# Patient Record
Sex: Female | Born: 1940 | Race: White | Hispanic: No | Marital: Single | Smoking: Never smoker
Health system: Southern US, Community
[De-identification: ages and names within clinical notes are randomized; demographics above are authoritative.]

## PROBLEM LIST (undated history)

## (undated) DIAGNOSIS — G8220 Paraplegia, unspecified: Secondary | ICD-10-CM

## (undated) DIAGNOSIS — I472 Ventricular tachycardia, unspecified: Secondary | ICD-10-CM

## (undated) DIAGNOSIS — D696 Thrombocytopenia, unspecified: Secondary | ICD-10-CM

## (undated) DIAGNOSIS — F0390 Unspecified dementia without behavioral disturbance: Secondary | ICD-10-CM

## (undated) DIAGNOSIS — R011 Cardiac murmur, unspecified: Secondary | ICD-10-CM

## (undated) DIAGNOSIS — I10 Essential (primary) hypertension: Secondary | ICD-10-CM

## (undated) DIAGNOSIS — A809 Acute poliomyelitis, unspecified: Secondary | ICD-10-CM

## (undated) HISTORY — PX: TONSILLECTOMY: SUR1361

---

## 2012-02-12 ENCOUNTER — Emergency Department (HOSPITAL_COMMUNITY): Payer: Medicare Other

## 2012-02-12 ENCOUNTER — Emergency Department (HOSPITAL_COMMUNITY)
Admission: EM | Admit: 2012-02-12 | Discharge: 2012-02-12 | Disposition: A | Discharge status: Skilled Nursing Facility | Payer: Medicare Other | Attending: Emergency Medicine | Admitting: Emergency Medicine

## 2012-02-12 ENCOUNTER — Encounter (HOSPITAL_COMMUNITY): Payer: Self-pay | Admitting: Emergency Medicine

## 2012-02-12 DIAGNOSIS — Z8679 Personal history of other diseases of the circulatory system: Secondary | ICD-10-CM | POA: Insufficient documentation

## 2012-02-12 DIAGNOSIS — Z862 Personal history of diseases of the blood and blood-forming organs and certain disorders involving the immune mechanism: Secondary | ICD-10-CM | POA: Insufficient documentation

## 2012-02-12 DIAGNOSIS — R011 Cardiac murmur, unspecified: Secondary | ICD-10-CM | POA: Insufficient documentation

## 2012-02-12 DIAGNOSIS — F0390 Unspecified dementia without behavioral disturbance: Secondary | ICD-10-CM | POA: Insufficient documentation

## 2012-02-12 DIAGNOSIS — Z8612 Personal history of poliomyelitis: Secondary | ICD-10-CM | POA: Insufficient documentation

## 2012-02-12 DIAGNOSIS — Z79899 Other long term (current) drug therapy: Secondary | ICD-10-CM | POA: Insufficient documentation

## 2012-02-12 DIAGNOSIS — E876 Hypokalemia: Secondary | ICD-10-CM | POA: Insufficient documentation

## 2012-02-12 DIAGNOSIS — I10 Essential (primary) hypertension: Secondary | ICD-10-CM | POA: Insufficient documentation

## 2012-02-12 DIAGNOSIS — N390 Urinary tract infection, site not specified: Secondary | ICD-10-CM | POA: Insufficient documentation

## 2012-02-12 HISTORY — DX: Ventricular tachycardia, unspecified: I47.20

## 2012-02-12 HISTORY — DX: Cardiac murmur, unspecified: R01.1

## 2012-02-12 HISTORY — DX: Unspecified dementia, unspecified severity, without behavioral disturbance, psychotic disturbance, mood disturbance, and anxiety: F03.90

## 2012-02-12 HISTORY — DX: Acute poliomyelitis, unspecified: A80.9

## 2012-02-12 HISTORY — DX: Essential (primary) hypertension: I10

## 2012-02-12 HISTORY — DX: Thrombocytopenia, unspecified: D69.6

## 2012-02-12 HISTORY — DX: Ventricular tachycardia: I47.2

## 2012-02-12 LAB — COMPREHENSIVE METABOLIC PANEL
Albumin: 2.6 g/dL — ABNORMAL LOW (ref 3.5–5.2)
BUN: 33 mg/dL — ABNORMAL HIGH (ref 6–23)
CO2: 31 mEq/L (ref 19–32)
Chloride: 84 mEq/L — ABNORMAL LOW (ref 96–112)
Creatinine, Ser: 0.51 mg/dL (ref 0.50–1.10)
GFR calc Af Amer: >90 mL/min (ref >90)
GFR calc non Af Amer: >90 mL/min (ref >90)
Glucose, Bld: 122 mg/dL — ABNORMAL HIGH (ref 70–99)
Total Bilirubin: 0.5 mg/dL (ref 0.3–1.2)

## 2012-02-12 LAB — URINE MICROSCOPIC-ADD ON

## 2012-02-12 LAB — CBC WITH DIFFERENTIAL/PLATELET
Basophils Absolute: 0 10*3/uL (ref 0.0–0.1)
Basophils Relative: 0 % (ref 0–1)
Eosinophils Relative: 0 % (ref 0–5)
Lymphocytes Relative: 3 % — ABNORMAL LOW (ref 12–46)
MCHC: 34.7 g/dL (ref 30.0–36.0)
Neutro Abs: 14 10*3/uL — ABNORMAL HIGH (ref 1.7–7.7)
Platelets: DECREASED 10*3/uL (ref 150–400)
RDW: 13.4 % (ref 11.5–15.5)
WBC: 16.7 10*3/uL — ABNORMAL HIGH (ref 4.0–10.5)

## 2012-02-12 LAB — URINALYSIS, ROUTINE W REFLEX MICROSCOPIC
Nitrite: POSITIVE — AB
Protein, ur: NEGATIVE mg/dL
Urobilinogen, UA: 1 mg/dL (ref 0.0–1.0)

## 2012-02-12 MED ORDER — DEXTROSE 5 % IV SOLN
1.0000 g | Freq: Once | INTRAVENOUS | Status: AC
  Administered 2012-02-12: 1 g via INTRAVENOUS
  Filled 2012-02-12: qty 10

## 2012-02-12 MED ORDER — POTASSIUM CHLORIDE ER 10 MEQ PO TBCR: 20.0000 meq | EXTENDED_RELEASE_TABLET | Freq: Two times a day (BID) | ORAL | Status: DC

## 2012-02-12 MED ORDER — POTASSIUM CHLORIDE CRYS ER 20 MEQ PO TBCR
60.0000 meq | EXTENDED_RELEASE_TABLET | Freq: Once | ORAL | Status: AC
  Administered 2012-02-12: 60 meq via ORAL
  Filled 2012-02-12: qty 2
  Filled 2012-02-12: qty 1

## 2012-02-12 MED ORDER — CEFUROXIME AXETIL 250 MG PO TABS: 250.0000 mg | ORAL_TABLET | Freq: Two times a day (BID) | ORAL | Status: DC

## 2012-02-12 NOTE — ED Notes (Signed)
ZOX:[id number]<AV> Expected date:02/12/12<BR> Expected time: 7:22 PM<BR> Means of arrival:Ambulance<BR> Comments:<BR> Gen weakness ? fever

## 2012-02-12 NOTE — ED Notes (Signed)
Pt from [facility]. C/O fever and chills for last hour. Not given tylenol. No previous complaints.

## 2012-02-12 NOTE — ED Provider Notes (Signed)
History     CSN: [id number]  Arrival date & time 02/12/12  1927   First MD Initiated Contact with Patient 02/12/12 1953      Chief Complaint  Patient presents with  . Fever    (Consider location/radiation/quality/duration/timing/severity/associated sxs/prior treatment) HPI Comments: Patient comes to the ER for evaluation of fever. Patient reports that she has frequent fevers date back to when she had polio. She says she does a lot of housework today and thinks that brought on fevers. She hasn't shows and symptoms lasted about an hour and then resolved. Patient without complaints now. She says she did not want to comment, but her doctor told her to. She has not had sore throat, cough, chest pain, abdominal pain, nausea, vomiting or diarrhea. She denies urinary symptoms.  Patient is a 72 y.o. female presenting with fever.  Fever Primary symptoms of the febrile illness include fever.    Past Medical History  Diagnosis Date  . Dementia   . Hypertension   . Heart murmur   . Polio   . Thrombocytopenia   . V-tach     No past surgical history on file.  No family history on file.  History  Substance Use Topics  . Smoking status: Not on file  . Smokeless tobacco: Not on file  . Alcohol Use:     OB History    Grav Para Term Preterm Abortions TAB SAB Ect Mult Living                  Review of Systems  Constitutional: Positive for fever.  All other systems reviewed and are negative.    Allergies  Review of patient's allergies indicates no known allergies.  Home Medications   Current Outpatient Rx  Name  Route  Sig  Dispense  Refill  . CALCIUM CARBONATE-VITAMIN D 500-200 MG-UNIT PO TABS   Oral   Take 1 tablet by mouth daily.         [name] DIVALPROEX SODIUM 125 MG PO TBEC   Oral   Take 125 mg by mouth 3 (three) times daily.         . DONEPEZIL HCL 5 MG PO TABS   Oral   Take 5 mg by mouth every evening.         . FUROSEMIDE 40 MG PO TABS   Oral   Take 40  mg by mouth daily.         [name] GABAPENTIN 100 MG PO CAPS   Oral   Take 100 mg by mouth daily.         [name] LORAZEPAM 0.5 MG PO TABS   Oral   Take 0.25 mg by mouth 2 (two) times daily.         [name] MEMANTINE HCL 5 MG PO TABS   Oral   Take 5 mg by mouth 2 (two) times daily.         [name] METOLAZONE 2.5 MG PO TABS   Oral   Take 2.5 mg by mouth daily.         [name] POTASSIUM CHLORIDE CRYS ER 20 MEQ PO TBCR   Oral   Take 20 mEq by mouth 3 (three) times daily.         . QUETIAPINE FUMARATE 50 MG PO TABS   Oral   Take 50 mg by mouth 2 (two) times daily.         . SERTRALINE HCL 50 MG PO TABS   Oral  Take 50 mg by mouth daily.           BP 117/54  Pulse 86  Temp 99.9 F (37.7 C) (Oral)  Resp 18  SpO2 97%  Physical Exam  Constitutional: She is oriented to person, place, and time. She appears well-developed and well-nourished. No distress.  HENT:  Head: Normocephalic and atraumatic.  Right Ear: Hearing normal.  Nose: Nose normal.  Mouth/Throat: Oropharynx is clear and moist and mucous membranes are normal.  Eyes: Conjunctivae normal and EOM are normal. Pupils are equal, round, and reactive to light.  Neck: Normal range of motion. Neck supple.  Cardiovascular: Normal rate, regular rhythm, S1 normal and S2 normal.  Exam reveals no gallop and no friction rub.   No murmur heard. Pulmonary/Chest: Effort normal and breath sounds normal. No respiratory distress. She exhibits no tenderness.  Abdominal: Soft. Normal appearance and bowel sounds are normal. There is no hepatosplenomegaly. There is no tenderness. There is no rebound, no guarding, no tenderness at McBurney's point and negative Murphy's sign. No hernia.  Musculoskeletal: Normal range of motion.  Neurological: She is alert and oriented to person, place, and time. She has normal strength. No cranial nerve deficit or sensory deficit. Coordination normal. GCS eye subscore is 4. GCS verbal subscore is 5. GCS motor subscore  is 6.  Skin: Skin is warm, dry and intact. No rash noted. No cyanosis.  Psychiatric: She has a normal mood and affect. Her speech is normal and behavior is normal. Thought content normal.    ED Course  Procedures (including critical care time)  Labs Reviewed  COMPREHENSIVE METABOLIC PANEL - Abnormal; Notable for the following:    Sodium 128 (*)     Potassium 2.6 (*)     Chloride 84 (*)     Glucose, Bld 122 (*)     BUN 33 (*)     Total Protein 5.8 (*)     Albumin 2.6 (*)     All other components within normal limits  URINALYSIS, ROUTINE W REFLEX MICROSCOPIC - Abnormal; Notable for the following:    APPearance CLOUDY (*)     Hgb urine dipstick SMALL (*)     Nitrite POSITIVE (*)     Leukocytes, UA MODERATE (*)     All other components within normal limits  CBC WITH DIFFERENTIAL - Abnormal; Notable for the following:    WBC 16.7 (*)     Neutrophils Relative 84 (*)     Neutro Abs 14.0 (*)     Lymphocytes Relative 3 (*)     Lymphs Abs 0.5 (*)     Monocytes Relative 13 (*)     Monocytes Absolute 2.2 (*)     All other components within normal limits  URINE MICROSCOPIC-ADD ON - Abnormal; Notable for the following:    Bacteria, UA FEW (*)     All other components within normal limits  CULTURE, BLOOD (ROUTINE X 2)  CULTURE, BLOOD (ROUTINE X 2)  URINE CULTURE   No results found.   Diagnosis: Urinary tract infection; Fever    MDM  Patient presents for evaluation of fever. Her examination was unremarkable. Workup does reveal evidence of urinary tract infection, likely the source of the patient's fever. She does not appear septic. Patient administered Rocephin and discharged back to the nursing home.        Gilda Crease, MD 02/15/12 [phone]

## 2012-02-14 NOTE — ED Notes (Signed)
[facility] lab reports positive blood culture; copy of chart to ED for review

## 2012-02-14 NOTE — ED Notes (Addendum)
Chart returned from EDP office  . Phone not in service. Patient presents with fever and elevated WBC on lab work. Call patient and if improving recommend f/u with PCP otherwise have patient return for possible admission per Fayrene Helper.

## 2012-02-15 LAB — URINE CULTURE

## 2012-02-16 LAB — CULTURE, BLOOD (ROUTINE X 2)

## 2012-02-16 NOTE — ED Notes (Signed)
+   Urine Patient treated with Ceftin-sensitive to same-Chart appended per protocol MD.

## 2012-02-19 LAB — CULTURE, BLOOD (ROUTINE X 2)

## 2012-05-20 ENCOUNTER — Emergency Department (HOSPITAL_COMMUNITY): Payer: Medicare Other

## 2012-05-20 ENCOUNTER — Encounter (HOSPITAL_COMMUNITY): Payer: Self-pay

## 2012-05-20 ENCOUNTER — Inpatient Hospital Stay (HOSPITAL_COMMUNITY)
Admission: EM | Admit: 2012-05-20 | Discharge: 2012-05-23 | DRG: 872 | Disposition: A | Discharge status: Skilled Nursing Facility | Payer: Medicare Other | Attending: Internal Medicine | Admitting: Internal Medicine

## 2012-05-20 DIAGNOSIS — F329 Major depressive disorder, single episode, unspecified: Secondary | ICD-10-CM

## 2012-05-20 DIAGNOSIS — I517 Cardiomegaly: Secondary | ICD-10-CM | POA: Diagnosis present

## 2012-05-20 DIAGNOSIS — R739 Hyperglycemia, unspecified: Secondary | ICD-10-CM

## 2012-05-20 DIAGNOSIS — E876 Hypokalemia: Secondary | ICD-10-CM

## 2012-05-20 DIAGNOSIS — F32A Depression, unspecified: Secondary | ICD-10-CM

## 2012-05-20 DIAGNOSIS — I959 Hypotension, unspecified: Secondary | ICD-10-CM

## 2012-05-20 DIAGNOSIS — E162 Hypoglycemia, unspecified: Secondary | ICD-10-CM | POA: Diagnosis present

## 2012-05-20 DIAGNOSIS — A419 Sepsis, unspecified organism: Secondary | ICD-10-CM

## 2012-05-20 DIAGNOSIS — D72829 Elevated white blood cell count, unspecified: Secondary | ICD-10-CM

## 2012-05-20 DIAGNOSIS — D696 Thrombocytopenia, unspecified: Secondary | ICD-10-CM

## 2012-05-20 DIAGNOSIS — A4151 Sepsis due to Escherichia coli [E. coli]: Principal | ICD-10-CM | POA: Diagnosis present

## 2012-05-20 DIAGNOSIS — F0390 Unspecified dementia without behavioral disturbance: Secondary | ICD-10-CM

## 2012-05-20 DIAGNOSIS — Z79899 Other long term (current) drug therapy: Secondary | ICD-10-CM

## 2012-05-20 DIAGNOSIS — N390 Urinary tract infection, site not specified: Secondary | ICD-10-CM

## 2012-05-20 DIAGNOSIS — F3289 Other specified depressive episodes: Secondary | ICD-10-CM

## 2012-05-20 DIAGNOSIS — Z8612 Personal history of poliomyelitis: Secondary | ICD-10-CM

## 2012-05-20 LAB — RAPID URINE DRUG SCREEN, HOSP PERFORMED
Amphetamines: NOT DETECTED
Benzodiazepines: NOT DETECTED
Cocaine: NOT DETECTED
Opiates: NOT DETECTED
Tetrahydrocannabinol: NOT DETECTED

## 2012-05-20 LAB — CBC WITH DIFFERENTIAL/PLATELET
Basophils Absolute: 0 10*3/uL (ref 0.0–0.1)
Basophils Relative: 0 % (ref 0–1)
Eosinophils Absolute: 0 10*3/uL (ref 0.0–0.7)
Hemoglobin: 12.1 g/dL (ref 12.0–15.0)
Lymphocytes Relative: 5 % — ABNORMAL LOW (ref 12–46)
MCH: 30.5 pg (ref 26.0–34.0)
MCHC: 33.8 g/dL (ref 30.0–36.0)
Monocytes Absolute: 1.6 10*3/uL — ABNORMAL HIGH (ref 0.1–1.0)
Neutrophils Relative %: 85 % — ABNORMAL HIGH (ref 43–77)
Platelets: 67 10*3/uL — ABNORMAL LOW (ref 150–400)

## 2012-05-20 LAB — URINALYSIS, ROUTINE W REFLEX MICROSCOPIC
Glucose, UA: NEGATIVE mg/dL
Ketones, ur: NEGATIVE mg/dL
Nitrite: POSITIVE — AB
Protein, ur: 30 mg/dL — AB
Urobilinogen, UA: 1 mg/dL (ref 0.0–1.0)

## 2012-05-20 LAB — POCT I-STAT, CHEM 8
BUN: 29 mg/dL — ABNORMAL HIGH (ref 6–23)
Chloride: 94 mEq/L — ABNORMAL LOW (ref 96–112)
Creatinine, Ser: 0.6 mg/dL (ref 0.50–1.10)
Potassium: 2.6 mEq/L — CL (ref 3.5–5.1)
Sodium: 139 mEq/L (ref 135–145)

## 2012-05-20 MED ORDER — SODIUM CHLORIDE 0.9 % IV BOLUS (SEPSIS)
500.0000 mL | Freq: Once | INTRAVENOUS | Status: AC
  Administered 2012-05-20: 500 mL via INTRAVENOUS

## 2012-05-20 MED ORDER — SODIUM CHLORIDE 0.9 % IV BOLUS (SEPSIS)
1000.0000 mL | Freq: Once | INTRAVENOUS | Status: AC
  Administered 2012-05-20: 1000 mL via INTRAVENOUS

## 2012-05-20 MED ORDER — DEXTROSE 5 % IV SOLN
1.0000 g | Freq: Once | INTRAVENOUS | Status: DC
  Administered 2012-05-21: 1 g via INTRAVENOUS
  Filled 2012-05-20: qty 10

## 2012-05-20 MED ORDER — VANCOMYCIN HCL IN DEXTROSE 1-5 GM/200ML-% IV SOLN
1000.0000 mg | Freq: Once | INTRAVENOUS | Status: AC
  Administered 2012-05-21: 1000 mg via INTRAVENOUS
  Filled 2012-05-20: qty 200

## 2012-05-20 MED ORDER — SODIUM CHLORIDE 0.9 % IV BOLUS (SEPSIS): 500.0000 mL | Freq: Once | INTRAVENOUS | Status: DC

## 2012-05-20 MED ORDER — ACETAMINOPHEN 325 MG PO TABS
650.0000 mg | ORAL_TABLET | Freq: Once | ORAL | Status: AC
  Administered 2012-05-20: 650 mg via ORAL
  Filled 2012-05-20: qty 2

## 2012-05-20 MED ORDER — POTASSIUM CHLORIDE CRYS ER 20 MEQ PO TBCR: 40.0000 meq | EXTENDED_RELEASE_TABLET | Freq: Once | ORAL | Status: DC

## 2012-05-20 NOTE — ED Notes (Signed)
ZOX:WR60<AV> Expected date:05/20/12<BR> Expected time: 9:33 PM<BR> Means of arrival:Ambulance<BR> Comments:<BR> Fever, AMS reported by staff

## 2012-05-20 NOTE — ED Notes (Signed)
Per EMS, Pt from [facility].  Per Facility, Pt has a fever 99.4 and has been altered since 3pm.  Per EMS, pt alert to self and answering questions appropriately.  Hx Polio, HTN, Vtach, Heart murmur, depression. Vitals:  146/82, hr 82, resp 20, CBG 145.

## 2012-05-20 NOTE — ED Notes (Signed)
Notified by Barbette Merino that pupils pinpoint.  Notified Dondra Spry, NP of findings.

## 2012-05-20 NOTE — ED Notes (Signed)
Critical I stat - Potassium 2.6.  [place] reported to Dondra Spry, NP [phone].  Per Dondra Spry, order BMP to confirm.  [place] Bridge RN aware.

## 2012-05-21 DIAGNOSIS — A419 Sepsis, unspecified organism: Secondary | ICD-10-CM | POA: Diagnosis present

## 2012-05-21 DIAGNOSIS — D72829 Elevated white blood cell count, unspecified: Secondary | ICD-10-CM | POA: Diagnosis present

## 2012-05-21 DIAGNOSIS — F0390 Unspecified dementia without behavioral disturbance: Secondary | ICD-10-CM | POA: Diagnosis present

## 2012-05-21 DIAGNOSIS — N390 Urinary tract infection, site not specified: Secondary | ICD-10-CM | POA: Diagnosis present

## 2012-05-21 DIAGNOSIS — D696 Thrombocytopenia, unspecified: Secondary | ICD-10-CM | POA: Diagnosis present

## 2012-05-21 DIAGNOSIS — E876 Hypokalemia: Secondary | ICD-10-CM | POA: Diagnosis present

## 2012-05-21 DIAGNOSIS — R739 Hyperglycemia, unspecified: Secondary | ICD-10-CM | POA: Diagnosis present

## 2012-05-21 DIAGNOSIS — F329 Major depressive disorder, single episode, unspecified: Secondary | ICD-10-CM | POA: Diagnosis present

## 2012-05-21 LAB — COMPREHENSIVE METABOLIC PANEL
CO2: 33 mEq/L — ABNORMAL HIGH (ref 19–32)
Calcium: 7.8 mg/dL — ABNORMAL LOW (ref 8.4–10.5)
Creatinine, Ser: 0.35 mg/dL — ABNORMAL LOW (ref 0.50–1.10)
GFR calc Af Amer: >90 mL/min (ref >90)
GFR calc non Af Amer: >90 mL/min (ref >90)
Glucose, Bld: 120 mg/dL — ABNORMAL HIGH (ref 70–99)
Total Protein: 5.3 g/dL — ABNORMAL LOW (ref 6.0–8.3)

## 2012-05-21 LAB — HEMOGLOBIN A1C: Mean Plasma Glucose: 94 mg/dL (ref <117)

## 2012-05-21 LAB — APTT: aPTT: 28 seconds (ref 24–37)

## 2012-05-21 LAB — CBC WITH DIFFERENTIAL/PLATELET
Hemoglobin: 12.4 g/dL (ref 12.0–15.0)
Lymphocytes Relative: 9 % — ABNORMAL LOW (ref 12–46)
Lymphs Abs: 1.1 10*3/uL (ref 0.7–4.0)
Neutro Abs: 10 10*3/uL — ABNORMAL HIGH (ref 1.7–7.7)
Neutrophils Relative %: 82 % — ABNORMAL HIGH (ref 43–77)
Platelets: 74 10*3/uL — ABNORMAL LOW (ref 150–400)
RBC: 3.97 MIL/uL (ref 3.87–5.11)
WBC: 12.1 10*3/uL — ABNORMAL HIGH (ref 4.0–10.5)

## 2012-05-21 LAB — TSH: TSH: 1.715 u[IU]/mL (ref 0.350–4.500)

## 2012-05-21 LAB — GLUCOSE, CAPILLARY: Glucose-Capillary: 80 mg/dL (ref 70–99)

## 2012-05-21 LAB — PROTIME-INR
INR: 1.32 (ref 0.00–1.49)
Prothrombin Time: 16.1 seconds — ABNORMAL HIGH (ref 11.6–15.2)

## 2012-05-21 LAB — LACTIC ACID, PLASMA: Lactic Acid, Venous: 0.8 mmol/L (ref 0.5–2.2)

## 2012-05-21 LAB — POTASSIUM: Potassium: 3.2 mEq/L — ABNORMAL LOW (ref 3.5–5.1)

## 2012-05-21 MED ORDER — SODIUM CHLORIDE 0.9 % IV BOLUS (SEPSIS)
1000.0000 mL | Freq: Once | INTRAVENOUS | Status: AC
  Administered 2012-05-21: 1000 mL via INTRAVENOUS

## 2012-05-21 MED ORDER — ONDANSETRON HCL 4 MG/2ML IJ SOLN: 4.0000 mg | Freq: Four times a day (QID) | INTRAMUSCULAR | Status: DC | PRN

## 2012-05-21 MED ORDER — BIOTENE DRY MOUTH MT LIQD
15.0000 mL | Freq: Two times a day (BID) | OROMUCOSAL | Status: DC
  Administered 2012-05-21 – 2012-05-23 (×5): 15 mL via OROMUCOSAL

## 2012-05-21 MED ORDER — SODIUM CHLORIDE 0.9 % IJ SOLN
3.0000 mL | Freq: Two times a day (BID) | INTRAMUSCULAR | Status: DC
  Administered 2012-05-21 – 2012-05-22 (×3): 3 mL via INTRAVENOUS

## 2012-05-21 MED ORDER — CALCIUM CARBONATE-VITAMIN D 500-200 MG-UNIT PO TABS
1.0000 | ORAL_TABLET | Freq: Every day | ORAL | Status: DC
Start: 2012-05-21 — End: 2012-05-23
  Administered 2012-05-21 – 2012-05-23 (×3): 1 via ORAL
  Filled 2012-05-21 (×3): qty 1

## 2012-05-21 MED ORDER — ONDANSETRON HCL 4 MG PO TABS: 4.0000 mg | ORAL_TABLET | Freq: Four times a day (QID) | ORAL | Status: DC | PRN

## 2012-05-21 MED ORDER — HYDROCODONE-ACETAMINOPHEN 5-325 MG PO TABS: 1.0000 | ORAL_TABLET | ORAL | Status: DC | PRN

## 2012-05-21 MED ORDER — VANCOMYCIN HCL IN DEXTROSE 1-5 GM/200ML-% IV SOLN
1000.0000 mg | Freq: Two times a day (BID) | INTRAVENOUS | Status: DC
  Administered 2012-05-21 – 2012-05-23 (×4): 1000 mg via INTRAVENOUS
  Filled 2012-05-21 (×6): qty 200

## 2012-05-21 MED ORDER — POTASSIUM CHLORIDE 10 MEQ/100ML IV SOLN
10.0000 meq | INTRAVENOUS | Status: AC
  Administered 2012-05-21 (×5): 10 meq via INTRAVENOUS
  Filled 2012-05-21: qty 200
  Filled 2012-05-21: qty 300

## 2012-05-21 MED ORDER — DONEPEZIL HCL 5 MG PO TABS
5.0000 mg | ORAL_TABLET | Freq: Every evening | ORAL | Status: DC
  Administered 2012-05-21 – 2012-05-22 (×2): 5 mg via ORAL
  Filled 2012-05-21 (×3): qty 1

## 2012-05-21 MED ORDER — QUETIAPINE FUMARATE 50 MG PO TABS
50.0000 mg | ORAL_TABLET | Freq: Two times a day (BID) | ORAL | Status: DC
  Administered 2012-05-21 – 2012-05-23 (×5): 50 mg via ORAL
  Filled 2012-05-21 (×6): qty 1

## 2012-05-21 MED ORDER — GABAPENTIN 100 MG PO CAPS
100.0000 mg | ORAL_CAPSULE | Freq: Every day | ORAL | Status: DC
  Administered 2012-05-21 – 2012-05-23 (×3): 100 mg via ORAL
  Filled 2012-05-21 (×3): qty 1

## 2012-05-21 MED ORDER — ENSURE PLUS PO LIQD
237.0000 mL | Freq: Every day | ORAL | Status: DC
  Administered 2012-05-21 – 2012-05-23 (×3): 237 mL via ORAL
  Filled 2012-05-21 (×3): qty 237

## 2012-05-21 MED ORDER — SODIUM CHLORIDE 0.9 % IV SOLN
INTRAVENOUS | Status: DC
Start: 2012-05-21 — End: 2012-05-21
  Administered 2012-05-21: 75 mL via INTRAVENOUS

## 2012-05-21 MED ORDER — POTASSIUM CHLORIDE CRYS ER 20 MEQ PO TBCR: 40.0000 meq | EXTENDED_RELEASE_TABLET | Freq: Once | ORAL | Status: DC

## 2012-05-21 MED ORDER — POTASSIUM CHLORIDE IN NACL 40-0.9 MEQ/L-% IV SOLN
INTRAVENOUS | Status: DC
  Administered 2012-05-21 (×2): via INTRAVENOUS
  Filled 2012-05-21 (×6): qty 1000

## 2012-05-21 MED ORDER — DIVALPROEX SODIUM 125 MG PO DR TAB
125.0000 mg | DELAYED_RELEASE_TABLET | Freq: Three times a day (TID) | ORAL | Status: DC
  Administered 2012-05-21 – 2012-05-23 (×7): 125 mg via ORAL
  Filled 2012-05-21 (×9): qty 1

## 2012-05-21 MED ORDER — POTASSIUM CHLORIDE CRYS ER 20 MEQ PO TBCR
20.0000 meq | EXTENDED_RELEASE_TABLET | Freq: Three times a day (TID) | ORAL | Status: DC
  Administered 2012-05-21 – 2012-05-22 (×6): 20 meq via ORAL
  Filled 2012-05-21 (×9): qty 1

## 2012-05-21 MED ORDER — DEXTROSE 5 % IV SOLN
1.0000 g | INTRAVENOUS | Status: DC
  Administered 2012-05-22 – 2012-05-23 (×2): 1 g via INTRAVENOUS
  Filled 2012-05-21 (×2): qty 10

## 2012-05-21 MED ORDER — POTASSIUM CHLORIDE 10 MEQ/100ML IV SOLN
10.0000 meq | INTRAVENOUS | Status: AC
  Administered 2012-05-21 (×4): 10 meq via INTRAVENOUS
  Filled 2012-05-21: qty 300
  Filled 2012-05-21: qty 100

## 2012-05-21 MED ORDER — MEMANTINE HCL 5 MG PO TABS
5.0000 mg | ORAL_TABLET | Freq: Two times a day (BID) | ORAL | Status: DC
  Administered 2012-05-21 – 2012-05-23 (×5): 5 mg via ORAL
  Filled 2012-05-21 (×6): qty 1

## 2012-05-21 MED ORDER — LORAZEPAM 0.5 MG PO TABS
0.2500 mg | ORAL_TABLET | Freq: Two times a day (BID) | ORAL | Status: DC
  Administered 2012-05-21 – 2012-05-23 (×5): 0.25 mg via ORAL
  Filled 2012-05-21 (×5): qty 1

## 2012-05-21 MED ORDER — SERTRALINE HCL 50 MG PO TABS
50.0000 mg | ORAL_TABLET | Freq: Every day | ORAL | Status: DC
  Administered 2012-05-21 – 2012-05-23 (×3): 50 mg via ORAL
  Filled 2012-05-21 (×3): qty 1

## 2012-05-21 MED ORDER — MORPHINE SULFATE 2 MG/ML IJ SOLN: 1.0000 mg | INTRAMUSCULAR | Status: DC | PRN

## 2012-05-21 MED ORDER — ACETAMINOPHEN 325 MG PO TABS: 325.0000 mg | ORAL_TABLET | Freq: Four times a day (QID) | ORAL | Status: DC | PRN

## 2012-05-21 NOTE — Evaluation (Signed)
Physical Therapy Evaluation Patient Details Name: Carolyn Ford MRN: [id number] DOB: May 29, 1940 Today's Date: 05/21/2012 Time: 1478-2956 PT Time Calculation (min): 41 min  PT Assessment / Plan / Recommendation Clinical Impression  72 yo female with h/o polio/ bilateral paralysis who was admitted /05/20/12 with UTI, early sepsis, hypotension. Pt. also has h/o dementia. Per [facility] staff pt required 2 person assist for "stand-pivot"  transfer to [facility], pt able to propel WC independently., total assist for ADL's. Pt will benefit from PT while in acute care.  Facility will need to determine if pt care can be provided at pt's current level. Both lower legs/feet mottled and cool to touch. RN noted also.    PT Assessment  Patient needs continued PT services    Follow Up Recommendations  Home health PT (unless higher level of care is necessary)    Does the patient have the potential to tolerate intense rehabilitation      Barriers to Discharge        Equipment Recommendations  None recommended by PT    Recommendations for Other Services     Frequency Min 3X/week    Precautions / Restrictions Precautions Precautions: Fall Precaution Comments: LE paralysis from polio Required Braces or Orthoses: Other Brace/Splint Other Brace/Splint: bil AFO's   Pertinent Vitals/Pain C/o both feet sensitive, noted mottling and coolness of both lower legs(stocking glove pattern). RN aware and noted and stated the feet have looked this way since admit.      Mobility  Bed Mobility Bed Mobility: Rolling Right;Rolling Left;Supine to Sit;Sit to Supine Rolling Right: 1: +2 Total assist Rolling Right: Patient Percentage: 10% Rolling Left: 1: +2 Total assist Rolling Left: Patient Percentage: 10% Supine to Sit: 1: +2 Total assist Supine to Sit: Patient Percentage: 30% Sit to Supine: 1: +2 Total assist Details for Bed Mobility Assistance: pt able to pull self into long sitting in bed with 2  armhold,  pt requires  total assistance to move to full sitting at the edge of bed, pt. did assist with moving R leg with UE's Transfers Transfers: Not assessed    Exercises     PT Diagnosis: Generalized weakness;Acute pain  PT Problem List: Decreased strength;Decreased activity tolerance;Decreased balance;Decreased mobility;Decreased cognition;Decreased knowledge of use of DME;Decreased safety awareness;Decreased knowledge of precautions;Impaired tone;Impaired sensation;Pain PT Treatment Interventions: DME instruction;Functional mobility training;Therapeutic activities;Balance training;Patient/family education   PT Goals Acute Rehab PT Goals PT Goal Formulation: Patient unable to participate in goal setting Time For Goal Achievement: 06/04/12 Potential to Achieve Goals: Fair Pt will Roll Supine to Right Side: with min assist PT Goal: Rolling Supine to Right Side - Progress: Goal set today Pt will Roll Supine to Left Side: with min assist PT Goal: Rolling Supine to Left Side - Progress: Goal set today Pt will go Supine/Side to Sit: with min assist PT Goal: Supine/Side to Sit - Progress: Goal set today Pt will go Sit to Supine/Side: with min assist PT Goal: Sit to Supine/Side - Progress: Goal set today Pt will Transfer Bed to Chair/Chair to Bed: with +2 total assist PT Transfer Goal: Bed to Chair/Chair to Bed - Progress: Goal set today Pt will Propel Wheelchair: 10 - 50 feet;with supervision PT Goal: Propel Wheelchair - Progress: Goal set today  Visit Information  Last PT Received On: 05/21/12 Assistance Needed: +2    Subjective Data  Subjective: I am in [place] Patient Stated Goal: pt unable    Prior Functioning  Home Living Type of Home: Assisted living Home Adaptive  Equipment: Wheelchair - manual Prior Function Level of Independence: Needs assistance Needs Assistance: Bathing;Dressing;Grooming;Toileting;Transfers Bath: Total Dressing: Total Grooming: Total Toileting:  Total Transfer Assistance:  +2 pivot using bil AFO Comments: per staff at [facility] pt was a 2 person assist to stand and pivot to WC using bil. ankle braces. assist for all ADL's. Communication Communication: Expressive difficulties    Cognition  Cognition Arousal/Alertness: Awake/alert Behavior During Therapy: Anxious Overall Cognitive Status: History of cognitive impairments - at baseline Area of Impairment: Orientation;Memory Orientation Level: Place;Time;Situation General Comments: pt constantly talking nonsensible conversation, at times sensible in describing how she transfers, although not completely sensible.    Extremity/Trunk Assessment Right Upper Extremity Assessment RUE ROM/Strength/Tone: [facility] for tasks assessed Left Upper Extremity Assessment LUE ROM/Strength/Tone: WFL for tasks assessed Right Lower Extremity Assessment RLE ROM/Strength/Tone: Deficits RLE ROM/Strength/Tone Deficits: trace forefoot movement. flaccid hip to fooot. RLE Sensation: Deficits RLE Sensation Deficits: hypersensitive to touch foot at any location Left Lower Extremity Assessment LLE ROM/Strength/Tone: Deficits LLE ROM/Strength/Tone Deficits: same as right LLE Sensation: Deficits LLE Sensation Deficits: same as right. Trunk Assessment Trunk Assessment: Kyphotic   Balance Balance Balance Assessed: Yes Static Sitting Balance Static Sitting - Balance Support: Bilateral upper extremity supported Static Sitting - Level of Assistance: 5: Stand by assistance;4: Min assist Static Sitting - Comment/# of Minutes: pt  will lose balance and unable to catch self if leans too far to side.   End of Session PT - End of Session Activity Tolerance: Patient limited by fatigue Patient left: in chair Nurse Communication: Need for lift equipment  GP     Rada Hay 05/21/2012, 2:23 PM Blanchard Kelch PT [phone]

## 2012-05-21 NOTE — Plan of Care (Signed)
Problem: Consults Goal: Skin Care Protocol Initiated - if Braden Score 18 or less If consults are not indicated, leave blank or document N/A Outcome: Completed/Met Date Met:  05/21/12 Q2hr turn, elevate heels

## 2012-05-21 NOTE — Progress Notes (Signed)
0815 - Recheck CBG=63. Administered another 4oz of apple juice. MD is going to put in diet order. Will continue to monitor.  Langley Gauss, RN

## 2012-05-21 NOTE — Clinical Social Work Psychosocial (Signed)
Clinical Social Work Department BRIEF PSYCHOSOCIAL ASSESSMENT 05/21/2012  Patient:  Carolyn Ford, Carolyn Ford     Account Number:  [id number]     Admit date:  05/20/2012  Clinical Social Worker:  Vennie Homans, Theresia Majors  Date/Time:  05/21/2012 12:00 N  Referred by:  CSW  Date Referred:  05/21/2012 Referred for  Other - See comment   Other Referral:   ADMITTED FROM ALF   Interview type:  Family Other interview type:   DISCUSSION WITH PT AND PHONE CALL TO NEPHEW/POA, Carolyn Ford    PSYCHOSOCIAL DATA Living Status:  FACILITY Admitted from facility:  [facility] Level of care:  Assisted Living Primary support name:  Carolyn Ford Primary support relationship to patient:  FAMILY Degree of support available:   POA HELPS MAKE DECISIONS    CURRENT CONCERNS Current Concerns  Post-Acute Placement   Other Concerns:   RETURN TO ALF WHEN MEDICALLY ABLE    SOCIAL WORK ASSESSMENT / PLAN CSW met with Pt and spoke with POA. Pt has been at ALF since July and plans to return once stable. Both want Pt to return to that ALF at discharge. CSW will work on return to ALF as appropriate.   Assessment/plan status:  Psychosocial Support/Ongoing Assessment of Needs Other assessment/ plan:   return to ALF   Information/referral to community resources:    PATIENT'S/FAMILY'S RESPONSE TO PLAN OF CARE: Pt and nephew aware and agree to plan to return to ALF at d/c. CSW not aware of any issues that would impact on change in dispo at this time. CSW to follow and assist.   Doreen Salvage, LCSW ICU/Stepdown Clinical Social Worker [facility] Cell [phone] Hours 8am-1200pm M-F

## 2012-05-21 NOTE — Progress Notes (Signed)
CARE MANAGEMENT NOTE 05/21/2012  Patient:  Carolyn Ford, Carolyn Ford   Account Number:  [id number]  Date Initiated:  05/21/2012  Documentation initiated by:  Roselle Norton  Subjective/Objective Assessment:   pt with fever, ams, wbc over 13.0 K 2.3,  hypotensive even after ns boluses.     Action/Plan:   from [facility] senior care   Anticipated DC Date:  05/24/2012   Anticipated DC Plan:  ASSISTED LIVING / REST HOME  In-house referral  Clinical Social Worker      DC Planning Services  NA      [facility] Choice  NA   Choice offered to / List presented to:  NA   DME arranged  NA      DME agency  NA     HH arranged  NA      HH agency  NA   Status of service:  In process, will continue to follow Medicare Important Message given?  NA - LOS <3 / Initial given by admissions (If response is "NO", the following Medicare IM given date fields will be blank) Date Medicare IM given:   Date Additional Medicare IM given:    Discharge Disposition:    Per UR Regulation:  Reviewed for med. necessity/level of care/duration of stay  If discussed at Long Length of Stay Meetings, dates discussed:    Comments:  [id number] Earlene Plater, RN, BSN, CCM:  CHART REVIEWED AND UPDATED.  Next chart review due on [id number]. NO DISCHARGE NEEDS PRESENT AT THIS TIME. CASE MANAGEMENT [phone]

## 2012-05-21 NOTE — H&P (Signed)
[facility] History and Physical  Carolyn Ford ZOX:[id number] DOB: [birth year] DOA: 05/20/2012  Referring physician: ED physician PCP: No primary provider on file.   Chief Complaint: Fever, confusion  HPI:  72 year old female with past medical history of polio, dementia and depression who presented from [facility] to [facility] ED for fevers and worsening mental status changes. Please note that the patient is not a good historian due to history of dementia. History is provided from ED physician and NH records. In nursing home, patient spike fever of 99.4 and appeared to be more confused than her usual baseline mental status. There was no respiratory distress noted in the nursing home. No falls. In ED, patient was hypotensive with BP of 96/35 and HR in range of  70-75; Tmax of 100.4 F and O2 saturation of 98% on 2 L nasal canula. Please note that the patient only received 500 cc of normal saline in ED. Blood pressure remains low at 98/43. CXR was significant for mild cardiomegaly and possible vascular congestion. CT head shows no acute intracranial findings. CBC  revealed leukocytosis of 16 and platelet count of 67. We do not have other labs available in Epic for comparison. UA was significant for WBC 21-50 and small leukocyte esterases. BMP was significant for hypokalemia of 2.6. Procalcitonin and lactic acid are still pending but obvious concern is for sepsis for which reason we are admitting the patient to SDU.  Assessment and Plan:  Principal Problem:  *Sepsis and Urinary tract infection - Patient was started on vancomycin and Rocephin in ED for possible sepsis - procalcitonin and lactic acid is still pending - Followup results of urine culture and blood cultures - Blood pressure is 98/43 after 500 cc normal saline bolus. We will continue IV fluids, normal saline at 75 cc an hour. Pressors are not required at this time. We will continue to monitor in step down unit.  Active  Problems:   Leukocytosis - Secondary to sepsis and UTI - Followup CBC in the morning   Hypokalemia - Repleted in ED - Followup BMP in the morning   Thrombocytopenia - Unclear etiology, possible sepsis. No previous values for comparison available. - Use SCD for DVT prophylaxis    Dementia - Continue Namenda and Aricept   Depression - Continue Seroquel  Code Status: Full Family Communication: Pt at bedside Disposition Plan: PT evaluation   Debbora Presto, MD  [facility] Pager [phone]  If 7PM-7AM, please contact night-coverage [email or web address] Password [facility] 05/21/2012, 12:09 AM  Review of Systems:  Unable to obtain due to patient's history of dementia  Past Medical History  Diagnosis Date  . Dementia   . Hypertension   . Heart murmur   . Polio   . Thrombocytopenia   . V-tach     History reviewed. No pertinent past surgical history.  Social History:  reports that she does not drink alcohol or use illicit drugs. Her tobacco history is not on file.  No Known Allergies  Family history: Unable to obtain due to patient's medical history of dementia  Prior to Admission medications   Medication Sig Start Date End Date Taking? Authorizing Provider  acetaminophen (TYLENOL) 325 MG tablet Take 325 mg by mouth every 6 (six) hours as needed for pain (fever).   Yes Historical Provider, MD  calcium-vitamin D (OSCAL WITH D) 500-200 MG-UNIT per tablet Take 1 tablet by mouth daily.   Yes Historical Provider, MD  divalproex (DEPAKOTE) 125 MG DR tablet Take 125 mg by mouth  3 (three) times daily.   Yes Historical Provider, MD  donepezil (ARICEPT) 5 MG tablet Take 5 mg by mouth every evening.   Yes Historical Provider, MD  Ensure Plus (ENSURE PLUS) LIQD Take 237 mLs by mouth daily.   Yes Historical Provider, MD  furosemide (LASIX) 40 MG tablet Take 40 mg by mouth daily.   Yes Historical Provider, MD  gabapentin (NEURONTIN) 100 MG capsule Take 100 mg by mouth daily.   Yes  Historical Provider, MD  LORazepam (ATIVAN) 0.5 MG tablet Take 0.25 mg by mouth 2 (two) times daily.   Yes Historical Provider, MD  memantine (NAMENDA) 5 MG tablet Take 5 mg by mouth 2 (two) times daily.   Yes Historical Provider, MD  metolazone (ZAROXOLYN) 2.5 MG tablet Take 2.5 mg by mouth daily.   Yes Historical Provider, MD  potassium chloride SA (K-DUR,KLOR-CON) 20 MEQ tablet Take 20 mEq by mouth 3 (three) times daily.   Yes Historical Provider, MD  QUEtiapine (SEROQUEL) 50 MG tablet Take 50 mg by mouth 2 (two) times daily.   Yes Historical Provider, MD  sertraline (ZOLOFT) 50 MG tablet Take 50 mg by mouth daily.   Yes Historical Provider, MD    Physical Exam: Filed Vitals:   05/20/12 2152 05/20/12 2245  BP: 96/35 98/43  Pulse: 75 70  Temp: 100.4 F (38 C)   TempSrc: Rectal   Resp: 18   Height: 5\' 6"  (1.676 m)   SpO2: 98%     Physical Exam  Constitutional: Appears ill, No distress.  HENT: Normocephalic.dry mucous membranes  Eyes: Conjunctivae normal, PERRLA Neck: Normal ROM. Neck supple. No JVD. No tracheal deviation.   CVS: RRR, S1/S2 +, SEm appreciated, no gallops, no carotid bruit.  Pulmonary: Effort and breath sounds normal, no stridor, rhonchi, wheezes, rales.  Abdominal: Soft. BS +,  no distension, tenderness, rebound or guarding.  Musculoskeletal: Normal range of motion. No edema and no tenderness.  Lymphadenopathy: No lymphadenopathy noted, cervical, inguinal. Neuro: No focal neurological deficits, patient is sleeping Skin: Skin is warm and dry. No rash noted. Not diaphoretic. No erythema. No pallor.   Labs on Admission:  Basic Metabolic Panel:  Recent Labs Lab 05/20/12 2220  NA 139  K 2.6*  CL 94*  GLUCOSE 140*  BUN 29*  CREATININE 0.60   CBC:  Recent Labs Lab 05/20/12 2210 05/20/12 2220  WBC 16.0*  --   NEUTROABS 13.6*  --   HGB 12.1 12.6  HCT 35.8* 37.0  MCV 90.2  --   PLT 67*  --    Radiological Exams on Admission: Ct Head Wo  Contrast 05/20/2012  *IMPRESSION: No acute intracranial abnormality.  Atrophy, chronic microvascular disease.     Dg Chest Port 1 View 05/20/2012  *IMPRESSION: Mild cardiomegaly, vascular congestion.     EKG: Normal sinus rhythm, no ST/T wave changes  Time spent: 75 minutes

## 2012-05-21 NOTE — Progress Notes (Addendum)
0745 - pt CBG=48. 4oz apple juice administered. Will recheck CBG in 30 min.  Langley Gauss, RN

## 2012-05-21 NOTE — Progress Notes (Signed)
[facility] PROGRESS NOTE  Carolyn Ford RUE:[id number] DOB: [birth year] DOA: 05/20/2012 PCP: Florentina Jenny, MD  Brief narrative: Carolyn Ford is an 72 y.o. female with a past medical history of dementia, polio, and hypertension who was sent from her skilled nursing home to the hospital on 05/20/2012 for evaluation of worsening confusion and fever. Upon initial evaluation emergency department, the patient was slightly hypotensive with a MAXIMUM TEMPERATURE of 100.4. Urinalysis revealed white blood cells and she was referred for addition for treatment of sepsis related to a urinary source.  Assessment/Plan: Principal Problem:   Sepsis secondary to a urinary tract infection -Source of sepsis appears to be from a UTI. -Placed on empiric vancomycin and Rocephin. -Pro calcitonin and lactic acid not appreciably elevated. -Followup urine and blood culture results. -Blood pressure low, but no current indication for pressor support. Active Problems:   Hyperglycemia / hypoglycemia -Check hemoglobin A1c.   Leukocytosis -White blood cell count trending down on empiric antibiotics.   Hypokalemia -Replacing. Magnesium is within normal limits.   Thrombocytopenia -Maybe from sepsis. Baseline platelet count not currently known.   Dementia -Continue Namenda and Aricept.   Depression -Continue Seroquel and Zoloft.  Code Status: Full. Family Communication: None at bedside. Disposition Plan: Back to SNF when stable.   Medical Consultants:  None.  Other Consultants:  Physical therapy  Anti-infectives:  Rocephin 05/20/2012--->  Vancomycin 05/20/2012--->   HPI/Subjective: Carolyn Ford is  Objective: Filed Vitals:   05/21/12 0630 05/21/12 0641 05/21/12 0700 05/21/12 0800  BP: 89/57 98/69 122/74   Pulse: 75 70 71   Temp:    97.3 F (36.3 C)  TempSrc:    Oral  Resp: 24 21 19    Height:      Weight:      SpO2: 100% 100% 100%     Intake/Output Summary (Last 24 hours) at  05/21/12 0841 Last data filed at 05/21/12 0700  Gross per 24 hour  Intake   1028 ml  Output      0 ml  Net   1028 ml    Exam: Gen:  NAD Cardiovascular:  RRR, No M/R/G Respiratory:  Lungs CTAB Gastrointestinal:  Abdomen soft, NT/ND, + BS Extremities:  No C/E/C  Data Reviewed: Basic Metabolic Panel:  Recent Labs Lab 05/20/12 2220 05/21/12 0154  NA 139 139  K 2.6* 2.3*  CL 94* 99  CO2  --  33*  GLUCOSE 140* 120*  BUN 29* 25*  CREATININE 0.60 0.35*  CALCIUM  --  7.8*  MG  --  2.0  PHOS  --  2.2*   GFR Estimated Creatinine Clearance: 66.6 ml/min (by C-G formula based on Cr of 0.35). Liver Function Tests:  Recent Labs Lab 05/21/12 0154  AST 20  ALT 9  ALKPHOS 51  BILITOT 0.3  PROT 5.3*  ALBUMIN 2.0*   Coagulation profile  Recent Labs Lab 05/21/12 0154  INR 1.32    CBC:  Recent Labs Lab 05/20/12 2210 05/20/12 2220 05/21/12 0154  WBC 16.0*  --  12.1*  NEUTROABS 13.6*  --  10.0*  HGB 12.1 12.6 12.4  HCT 35.8* 37.0 36.4  MCV 90.2  --  91.7  PLT 67*  --  74*   CBG:  Recent Labs Lab 05/21/12 0747  GLUCAP 48*   Hgb A1c No results found for this basename: HGBA1C,  in the last 72 hours  Microbiology Recent Results (from the past 240 hour(s))  MRSA PCR SCREENING     Status: None   Collection  Time    05/21/12  1:28 AM      Result Value Range Status   MRSA by PCR NEGATIVE  NEGATIVE Final   Comment:            The GeneXpert MRSA Assay (FDA     approved for NASAL specimens     only), is one component of a     comprehensive MRSA colonization     surveillance program. It is not     intended to diagnose MRSA     infection nor to guide or     monitor treatment for     MRSA infections.     Procedures and Diagnostic Studies: Ct Head Wo Contrast  05/20/2012  *RADIOLOGY REPORT*  Clinical Data: Altered mental status, hypotension.  CT HEAD WITHOUT CONTRAST  Technique:  Contiguous axial images were obtained from the base of the skull through the  vertex without contrast.  Comparison: None  Findings: There is atrophy and chronic small vessel disease changes. No acute intracranial abnormality.  Specifically, no hemorrhage, hydrocephalus, mass lesion, acute infarction, or significant intracranial injury.  No acute calvarial abnormality. Visualized paranasal sinuses and mastoids clear.  Orbital soft tissues unremarkable.  IMPRESSION: No acute intracranial abnormality.  Atrophy, chronic microvascular disease.   Original Report Authenticated By: Charlett Nose, M.D.    Dg Chest Port 1 View  05/20/2012  *RADIOLOGY REPORT*  Clinical Data: Altered mental status.  PORTABLE CHEST - 1 VIEW  Comparison: 02/12/2012  Findings: Mild cardiomegaly and vascular congestion.  No overt edema.  No confluent opacity or effusion.  No acute bony abnormality.  Severe degenerative changes in the shoulders bilaterally  IMPRESSION: Mild cardiomegaly, vascular congestion.   Original Report Authenticated By: Charlett Nose, M.D.     Scheduled Meds: . antiseptic oral rinse  15 mL Mouth Rinse BID  . calcium-vitamin D  1 tablet Oral Daily  . [START ON 05/22/2012] cefTRIAXone (ROCEPHIN)  IV  1 g Intravenous Q24H  . divalproex  125 mg Oral TID  . donepezil  5 mg Oral QPM  . Ensure Plus  237 mL Oral Daily  . gabapentin  100 mg Oral Daily  . LORazepam  0.25 mg Oral BID  . memantine  5 mg Oral BID  . potassium chloride  10 mEq Intravenous Q1 Hr x 6  . potassium chloride SA  20 mEq Oral TID  . QUEtiapine  50 mg Oral BID  . sertraline  50 mg Oral Daily  . sodium chloride  500 mL Intravenous Once  . sodium chloride  3 mL Intravenous Q12H  . vancomycin  1,000 mg Intravenous Q12H   Continuous Infusions: . 0.9 % NaCl with KCl 40 mEq / L      Time spent: 35 minutes.   LOS: 1 day   RAMA,CHRISTINA  [facility] Pager [phone].  If 8PM-8AM, please contact night-coverage at [email or web address], password [facility] 05/21/2012, 8:41 AM

## 2012-05-21 NOTE — Progress Notes (Signed)
ANTIBIOTIC CONSULT NOTE - INITIAL  Pharmacy Consult for Vancomycin & Rocephin Indication: UTI/Sepsis  No Known Allergies  Patient Measurements: Height: 5\' 6"  (167.6 cm) Weight: 175 lb 7.8 oz (79.6 kg) IBW/kg (Calculated) : 59.3  Vital Signs: Temp: 100.4 F (38 C) (05/11 2152) Temp src: Rectal (05/11 2152) BP: 96/46 mmHg (05/12 0100) Pulse Rate: 72 (05/12 0100) Intake/Output from previous day: 05/11 0701 - 05/12 0700 In: 3 [I.V.:3] Out: -  Intake/Output from this shift: Total I/O In: 3 [I.V.:3] Out: -   Labs:  Recent Labs  05/20/12 2210 05/20/12 2220  WBC 16.0*  --   HGB 12.1 12.6  PLT 67*  --   CREATININE  --  0.60   Estimated Creatinine Clearance: 66.6 ml/min (by C-G formula based on Cr of 0.6). No results found for this basename: VANCOTROUGH, VANCOPEAK, VANCORANDOM, GENTTROUGH, GENTPEAK, GENTRANDOM, TOBRATROUGH, TOBRAPEAK, TOBRARND, AMIKACINPEAK, AMIKACINTROU, AMIKACIN,  in the last 72 hours   Microbiology: No results found for this or any previous visit (from the past 720 hour(s)).  Medical History: Past Medical History  Diagnosis Date  . Dementia   . Hypertension   . Heart murmur   . Polio   . Thrombocytopenia   . V-tach     Medications:  Scheduled:  . [COMPLETED] acetaminophen  650 mg Oral Once  . calcium-vitamin D  1 tablet Oral Daily  . [START ON 05/22/2012] cefTRIAXone (ROCEPHIN)  IV  1 g Intravenous Q24H  . divalproex  125 mg Oral TID  . donepezil  5 mg Oral QPM  . Ensure Plus  237 mL Oral Daily  . gabapentin  100 mg Oral Daily  . LORazepam  0.25 mg Oral BID  . memantine  5 mg Oral BID  . potassium chloride  10 mEq Intravenous Q1 Hr x 4  . potassium chloride SA  20 mEq Oral TID  . QUEtiapine  50 mg Oral BID  . sertraline  50 mg Oral Daily  . [COMPLETED] sodium chloride  1,000 mL Intravenous Once  . [COMPLETED] sodium chloride  1,000 mL Intravenous Once  . [COMPLETED] sodium chloride  500 mL Intravenous Once  . sodium chloride  500 mL  Intravenous Once  . sodium chloride  3 mL Intravenous Q12H  . [COMPLETED] vancomycin  1,000 mg Intravenous Once  . [COMPLETED] cefTRIAXone (ROCEPHIN)  IV  1 g Intravenous Once  . [DISCONTINUED] potassium chloride  40 mEq Oral Once   Infusions:  . sodium chloride 75 mL (05/21/12 0155)   Assessment:  72 year old female with chief complaint of confusion with fever  Patient received initial Vancomycin 1gm IV and Rocephin 1gm IV @ 00:32 today  Admitted with sepsis and UTI  Blood cultures x 2 and urine culture ordered   Goal of Therapy:  Vancomycin trough level 15-20 mcg/ml  Plan:  Measure antibiotic drug levels at steady state Follow up culture results Rocephin 1gm IV q24h Vancomycin 1gm IV q12h  Jermia Rigsby, Joselyn Glassman, PharmD 05/21/2012,2:14 AM

## 2012-05-21 NOTE — ED Provider Notes (Signed)
History     CSN: [id number]  Arrival date & time 05/20/12  2141   First MD Initiated Contact with Patient 05/20/12 2149      Chief Complaint  Patient presents with  . Fever  . Altered Mental Status    (Consider location/radiation/quality/duration/timing/severity/associated sxs/prior treatment) Patient is a 72 y.o. female presenting with fever and altered mental status. The history is provided by the patient.  Fever Max temp prior to arrival:  99.9 Temp source:  Unable to specify Severity:  Mild Onset quality:  Sudden Timing:  Unable to specify Chronicity:  New Relieved by:  None tried Ineffective treatments:  None tried Associated symptoms: chest pain and myalgias   Associated symptoms: no cough, no diarrhea, no dysuria, no headaches, no nausea and no rhinorrhea   Altered Mental Status Associated symptoms include chest pain, a fever and myalgias. Pertinent negatives include no coughing, headaches or nausea.    Past Medical History  Diagnosis Date  . Dementia   . Hypertension   . Heart murmur   . Polio   . Thrombocytopenia   . V-tach     History reviewed. No pertinent past surgical history.  History reviewed. No pertinent family history.  History  Substance Use Topics  . Smoking status: Not on file  . Smokeless tobacco: Not on file  . Alcohol Use: No    OB History   Grav Para Term Preterm Abortions TAB SAB Ect Mult Living                  Review of Systems  Unable to perform ROS: Dementia  Constitutional: Positive for fever and activity change.  HENT: Negative for rhinorrhea.   Respiratory: Negative for cough.   Cardiovascular: Positive for chest pain.  Gastrointestinal: Negative for nausea and diarrhea.  Genitourinary: Negative for dysuria.  Musculoskeletal: Positive for myalgias.  Skin: Positive for pallor.  Neurological: Negative for headaches.  Psychiatric/Behavioral: Positive for altered mental status.  All other systems reviewed and are  negative.    Allergies  Review of patient's allergies indicates no known allergies.  Home Medications   No current outpatient prescriptions on file.  BP 109/74  Pulse 65  Temp(Src) 98.7 F (37.1 C) (Axillary)  Resp 13  Ht 5\' 6"  (1.676 m)  Wt 175 lb 7.8 oz (79.6 kg)  BMI 28.34 kg/m2  SpO2 98%  Physical Exam  Nursing note and vitals reviewed. Constitutional: She appears well-nourished.  HENT:  Head: Normocephalic.  Eyes: Pupils are equal, round, and reactive to light.  Neck: Normal range of motion.  Cardiovascular: Normal rate and regular rhythm.   Pulmonary/Chest: Effort normal and breath sounds normal.  Abdominal: Soft. Bowel sounds are normal. She exhibits no distension.  Musculoskeletal:  Hx polio with atrophy of lower extremities  Skin: Skin is warm and dry. No rash noted.    ED Course  Procedures (including critical care time)  Labs Reviewed  CBC WITH DIFFERENTIAL - Abnormal; Notable for the following:    WBC 16.0 (*)    HCT 35.8 (*)    Platelets 67 (*)    Neutrophils Relative 85 (*)    Lymphocytes Relative 5 (*)    Neutro Abs 13.6 (*)    Monocytes Absolute 1.6 (*)    All other components within normal limits  URINALYSIS, ROUTINE W REFLEX MICROSCOPIC - Abnormal; Notable for the following:    APPearance CLOUDY (*)    Hgb urine dipstick TRACE (*)    Protein, ur 30 (*)  Nitrite POSITIVE (*)    Leukocytes, UA SMALL (*)    All other components within normal limits  URINE MICROSCOPIC-ADD ON - Abnormal; Notable for the following:    Squamous Epithelial / LPF MANY (*)    Bacteria, UA MANY (*)    All other components within normal limits  COMPREHENSIVE METABOLIC PANEL - Abnormal; Notable for the following:    Potassium 2.3 (*)    CO2 33 (*)    Glucose, Bld 120 (*)    BUN 25 (*)    Creatinine, Ser 0.35 (*)    Calcium 7.8 (*)    Total Protein 5.3 (*)    Albumin 2.0 (*)    All other components within normal limits  PHOSPHORUS - Abnormal; Notable for the  following:    Phosphorus 2.2 (*)    All other components within normal limits  CBC WITH DIFFERENTIAL - Abnormal; Notable for the following:    WBC 12.1 (*)    Platelets 74 (*)    Neutrophils Relative 82 (*)    Neutro Abs 10.0 (*)    Lymphocytes Relative 9 (*)    All other components within normal limits  PROTIME-INR - Abnormal; Notable for the following:    Prothrombin Time 16.1 (*)    All other components within normal limits  POCT I-STAT, CHEM 8 - Abnormal; Notable for the following:    Potassium 2.6 (*)    Chloride 94 (*)    BUN 29 (*)    Glucose, Bld 140 (*)    Calcium, Ion 1.10 (*)    All other components within normal limits  MRSA PCR SCREENING  URINE CULTURE  CULTURE, BLOOD (ROUTINE X 2)  CULTURE, BLOOD (ROUTINE X 2)  URINE RAPID DRUG SCREEN (HOSP PERFORMED)  LACTIC ACID, PLASMA  PROCALCITONIN  APTT  TSH  POTASSIUM   Ct Head Wo Contrast  05/20/2012  *RADIOLOGY REPORT*  Clinical Data: Altered mental status, hypotension.  CT HEAD WITHOUT CONTRAST  Technique:  Contiguous axial images were obtained from the base of the skull through the vertex without contrast.  Comparison: None  Findings: There is atrophy and chronic small vessel disease changes. No acute intracranial abnormality.  Specifically, no hemorrhage, hydrocephalus, mass lesion, acute infarction, or significant intracranial injury.  No acute calvarial abnormality. Visualized paranasal sinuses and mastoids clear.  Orbital soft tissues unremarkable.  IMPRESSION: No acute intracranial abnormality.  Atrophy, chronic microvascular disease.   Original Report Authenticated By: Charlett Nose, M.D.    Dg Chest Port 1 View  05/20/2012  *RADIOLOGY REPORT*  Clinical Data: Altered mental status.  PORTABLE CHEST - 1 VIEW  Comparison: 02/12/2012  Findings: Mild cardiomegaly and vascular congestion.  No overt edema.  No confluent opacity or effusion.  No acute bony abnormality.  Severe degenerative changes in the shoulders bilaterally   IMPRESSION: Mild cardiomegaly, vascular congestion.   Original Report Authenticated By: Charlett Nose, M.D.      1. UTI (lower urinary tract infection)   2. Sepsis associated hypotension   3. Dementia   4. Depression   5. Hypokalemia       MDM  Patient remain hypotensive after IV fluids will continue aggressive fluid replacement, IV antibiotics K replacement         Arman Filter, NP 05/21/12 0353  Arman Filter, NP 05/21/12 0354  Arman Filter, NP 05/21/12 [phone]

## 2012-05-22 ENCOUNTER — Encounter (HOSPITAL_COMMUNITY): Payer: Self-pay | Admitting: *Deleted

## 2012-05-22 LAB — GLUCOSE, CAPILLARY
Glucose-Capillary: 98 mg/dL (ref 70–99)
Glucose-Capillary: 73 mg/dL (ref 70–99)

## 2012-05-22 LAB — BASIC METABOLIC PANEL
BUN: 10 mg/dL (ref 6–23)
GFR calc Af Amer: >90 mL/min (ref >90)
GFR calc non Af Amer: >90 mL/min (ref >90)
Potassium: 3.9 mEq/L (ref 3.5–5.1)
Sodium: 138 mEq/L (ref 135–145)

## 2012-05-22 LAB — CBC
Hemoglobin: 11.6 g/dL — ABNORMAL LOW (ref 12.0–15.0)
MCHC: 34.2 g/dL (ref 30.0–36.0)
RDW: 14.3 % (ref 11.5–15.5)

## 2012-05-22 LAB — URINE CULTURE

## 2012-05-22 NOTE — Progress Notes (Signed)
PT Cancellation Note  Patient Details Name: Carolyn Ford MRN: [id number] DOB: [birth year]   Cancelled Treatment:    Reason Eval/Treat Not Completed: Fatigue/lethargy limiting ability to participate.  Pt sleeping when PT arrived and unable to arouse pt with voice or touch.  Will check back tomorrow.    Thanks,    Vista Deck 05/22/2012, 3:58 PM

## 2012-05-22 NOTE — Clinical Social Work Note (Signed)
CSW updated FL2 and placed on chart for signing. CSW spoke with Crystal, Admission Rep At [facility] and sent her clinical updates. They are expecting Pt to return to their facility in the next day or two. CSW will continue to follow for return to ALF.   Doreen Salvage, LCSW ICU/Stepdown Clinical Social Worker [facility] Cell [phone] Hours 8am-1200pm M-F

## 2012-05-22 NOTE — Progress Notes (Signed)
Patient arrived from ICU to room 1345. VSS. Patient without complaints.

## 2012-05-22 NOTE — Progress Notes (Signed)
Pt refused SCDs, c/o of bilateral leg tenderness. 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

## 2012-05-22 NOTE — Progress Notes (Signed)
[facility] PROGRESS NOTE  Carolyn Ford ZOX:[id number] DOB: May 17, 1940 DOA: 05/20/2012 PCP: Carolyn Jenny, MD  Brief narrative: Carolyn Ford is an 72 y.o. female with a past medical history of dementia, polio, and hypertension who was sent from her skilled nursing home to the hospital on 05/20/2012 for evaluation of worsening confusion and fever. Upon initial evaluation emergency department, the patient was slightly hypotensive with a MAXIMUM TEMPERATURE of 100.4. Urinalysis revealed white blood cells and she was referred for admission for treatment of sepsis related to a urinary source.  Assessment/Plan: Principal Problem:   Sepsis secondary to a urinary tract infection -Source of sepsis appears to be from a UTI. -Placed on empiric vancomycin and Rocephin. -Pro calcitonin and lactic acid not appreciably elevated. -Urine culture growing Escherichia coli. Blood cultures negative to date. -Blood pressure remained stable. Active Problems:   Hyperglycemia / hypoglycemia -Hemoglobin A1c 4.9%   Leukocytosis -White blood cell count now WNL on empiric antibiotics.   Hypokalemia -Replaced. Magnesium is within normal limits.   Thrombocytopenia -Maybe from sepsis. Baseline platelet count not currently known.   Dementia -Continue Namenda and Aricept.   Depression -Continue Seroquel and Zoloft.  Code Status: Full. Family Communication: Carolyn Ford updated by telephone [phone]) . Disposition Plan: Back to SNF in next 24-48 hours.   Medical Consultants:  None.  Other Consultants:  Physical therapy  Anti-infectives:  Rocephin 05/20/2012--->  Vancomycin 05/20/2012--->   HPI/Subjective: Carolyn Ford is without complaint.  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  No nausea or vomiting.  Objective: Filed Vitals:   05/22/12 0400 05/22/12 0500  05/22/12 0600 05/22/12 0800  BP: 132/88  105/36   Pulse: 74  70   Temp: 97.8 F (36.6 C)   97.5 F (36.4 C)  TempSrc:    Axillary  Resp: 22  18   Height:      Weight:  82.6 kg (182 lb 1.6 oz)    SpO2: 97%  98%     Intake/Output Summary (Last 24 hours) at 05/22/12 0954 Last data filed at 05/22/12 0600  Gross per 24 hour  Intake   2025 ml  Output    200 ml  Net   1825 ml    Exam: Gen:  NAD Cardiovascular:  RRR, No M/R/G Respiratory:  Lungs CTAB Gastrointestinal:  Abdomen soft, NT/ND, + BS Extremities:  No C/E/C  Data Reviewed: Basic Metabolic Panel:  Recent Labs Lab 05/20/12 2220 05/21/12 0154 05/21/12 0948 05/22/12 0800  NA 139 139  --  138  K 2.6* 2.3* 3.2* 3.9  CL 94* 99  --  102  CO2  --  33*  --  32  GLUCOSE 140* 120*  --  84  BUN 29* 25*  --  10  CREATININE 0.60 0.35*  --  0.35*  CALCIUM  --  7.8*  --  8.0*  MG  --  2.0  --   --   PHOS  --  2.2*  --   --    GFR Estimated Creatinine Clearance: 67.8 ml/min (by C-G formula based on Cr of 0.35). Liver Function Tests:  Recent Labs Lab 05/21/12 0154  AST 20  ALT 9  ALKPHOS 51  BILITOT 0.3  PROT 5.3*  ALBUMIN 2.0*   Coagulation profile  Recent Labs Lab 05/21/12 0154  INR 1.32    CBC:  Recent Labs Lab 05/20/12 2210 05/20/12  2220 05/21/12 0154 05/22/12 0800  WBC 16.0*  --  12.1* 7.8  NEUTROABS 13.6*  --  10.0*  --   HGB 12.1 12.6 12.4 11.6*  HCT 35.8* 37.0 36.4 33.9*  MCV 90.2  --  91.7 91.1  PLT 67*  --  74* 90*   CBG:  Recent Labs Lab 05/21/12 0747 05/21/12 0819 05/21/12 2008 05/22/12 0340 05/22/12 0748  GLUCAP 48* 63* 80 98 73   Hgb A1c  Recent Labs  05/21/12 0154  HGBA1C 4.9    Microbiology Recent Results (from the past 240 hour(s))  URINE CULTURE     Status: None   Collection Time    05/20/12 10:45 PM      Result Value Range Status   Specimen Description URINE, CATHETERIZED   Final   Special Requests NONE   Final   Culture  Setup Time 05/21/2012 02:22    Final   Colony Count >=100,000 COLONIES/ML   Final   Culture ESCHERICHIA COLI   Final   Report Status PENDING   Incomplete  CULTURE, BLOOD (ROUTINE X 2)     Status: None   Collection Time    05/20/12 11:42 PM      Result Value Range Status   Specimen Description BLOOD RPFA   Final   Special Requests Normal BOTTLES DRAWN AEROBIC AND ANAEROBIC 5CC   Final   Culture  Setup Time 05/21/2012 10:56   Final   Culture     Final   Value:        BLOOD CULTURE RECEIVED NO GROWTH TO DATE CULTURE WILL BE HELD FOR 5 DAYS BEFORE ISSUING A FINAL NEGATIVE REPORT   Report Status PENDING   Incomplete  CULTURE, BLOOD (ROUTINE X 2)     Status: None   Collection Time    05/20/12 11:47 PM      Result Value Range Status   Specimen Description BLOOD L HAND   Final   Special Requests Normal BOTTLES DRAWN AEROBIC AND ANAEROBIC 5CC   Final   Culture  Setup Time 05/21/2012 10:55   Final   Culture     Final   Value:        BLOOD CULTURE RECEIVED NO GROWTH TO DATE CULTURE WILL BE HELD FOR 5 DAYS BEFORE ISSUING A FINAL NEGATIVE REPORT   Report Status PENDING   Incomplete  MRSA PCR SCREENING     Status: None   Collection Time    05/21/12  1:28 AM      Result Value Range Status   MRSA by PCR NEGATIVE  NEGATIVE Final   Comment:            The GeneXpert MRSA Assay (FDA     approved for NASAL specimens     only), is one component of a     comprehensive MRSA colonization     surveillance program. It is not     intended to diagnose MRSA     infection nor to guide or     monitor treatment for     MRSA infections.     Procedures and Diagnostic Studies: Ct Head Wo Contrast  05/20/2012  *RADIOLOGY REPORT*  Clinical Data: Altered mental status, hypotension.  CT HEAD WITHOUT CONTRAST  Technique:  Contiguous axial images were obtained from the base of the skull through the vertex without contrast.  Comparison: None  Findings: There is atrophy and chronic small vessel disease changes. No acute intracranial abnormality.   Specifically, no hemorrhage, hydrocephalus, mass lesion,  acute infarction, or significant intracranial injury.  No acute calvarial abnormality. Visualized paranasal sinuses and mastoids clear.  Orbital soft tissues unremarkable.  IMPRESSION: No acute intracranial abnormality.  Atrophy, chronic microvascular disease.   Original Report Authenticated By: Charlett Nose, M.D.    Dg Chest Port 1 View  05/20/2012  *RADIOLOGY REPORT*  Clinical Data: Altered mental status.  PORTABLE CHEST - 1 VIEW  Comparison: 02/12/2012  Findings: Mild cardiomegaly and vascular congestion.  No overt edema.  No confluent opacity or effusion.  No acute bony abnormality.  Severe degenerative changes in the shoulders bilaterally  IMPRESSION: Mild cardiomegaly, vascular congestion.   Original Report Authenticated By: Charlett Nose, M.D.     Scheduled Meds: . antiseptic oral rinse  15 mL Mouth Rinse BID  . calcium-vitamin D  1 tablet Oral Daily  . cefTRIAXone (ROCEPHIN)  IV  1 g Intravenous Q24H  . divalproex  125 mg Oral TID  . donepezil  5 mg Oral QPM  . Ensure Plus  237 mL Oral Daily  . gabapentin  100 mg Oral Daily  . LORazepam  0.25 mg Oral BID  . memantine  5 mg Oral BID  . potassium chloride SA  20 mEq Oral TID  . QUEtiapine  50 mg Oral BID  . sertraline  50 mg Oral Daily  . sodium chloride  500 mL Intravenous Once  . sodium chloride  3 mL Intravenous Q12H  . vancomycin  1,000 mg Intravenous Q12H   Continuous Infusions: . 0.9 % NaCl with KCl 40 mEq / L 75 mL/hr at 05/21/12 1902    Time spent: 35 minutes.   LOS: 2 days   RAMA,CHRISTINA  [facility] Pager [phone].  If 8PM-8AM, please contact night-coverage at [email or web address], password [facility] 05/22/2012, 9:54 AM

## 2012-05-23 DIAGNOSIS — R7309 Other abnormal glucose: Secondary | ICD-10-CM

## 2012-05-23 LAB — CBC
HCT: 37.1 % (ref 36.0–46.0)
Hemoglobin: 12.6 g/dL (ref 12.0–15.0)
MCH: 30.9 pg (ref 26.0–34.0)
MCHC: 34 g/dL (ref 30.0–36.0)

## 2012-05-23 LAB — BASIC METABOLIC PANEL
BUN: 11 mg/dL (ref 6–23)
CO2: 27 mEq/L (ref 19–32)
Chloride: 105 mEq/L (ref 96–112)
Glucose, Bld: 78 mg/dL (ref 70–99)
Potassium: 5.5 mEq/L — ABNORMAL HIGH (ref 3.5–5.1)

## 2012-05-23 MED ORDER — LORAZEPAM 0.5 MG PO TABS: 0.2500 mg | ORAL_TABLET | Freq: Two times a day (BID) | ORAL | Status: DC

## 2012-05-23 MED ORDER — SERTRALINE HCL 50 MG PO TABS: 50.0000 mg | ORAL_TABLET | Freq: Every day | ORAL | Status: AC

## 2012-05-23 MED ORDER — QUETIAPINE FUMARATE 50 MG PO TABS: 50.0000 mg | ORAL_TABLET | Freq: Two times a day (BID) | ORAL | Status: DC

## 2012-05-23 MED ORDER — SODIUM POLYSTYRENE SULFONATE 15 GM/60ML PO SUSP
15.0000 g | Freq: Once | ORAL | Status: AC
  Administered 2012-05-23: 15 g via ORAL
  Filled 2012-05-23: qty 60

## 2012-05-23 MED ORDER — NITROFURANTOIN MONOHYD MACRO 100 MG PO CAPS: 100.0000 mg | ORAL_CAPSULE | Freq: Two times a day (BID) | ORAL | Status: DC

## 2012-05-23 NOTE — Discharge Summary (Signed)
Physician Discharge Summary  Carolyn Ford ZOX:[id number] DOB: 08-Jul-1940 DOA: 05/20/2012  PCP: Florentina Jenny, MD  Admit date: 05/20/2012 Discharge date: 05/23/2012  Recommendations for Outpatient Follow-up:  1. please take Macrobid 100 mg twice daily for 4 days on discharge for urinary tract infection. 2. please stop take potassium supplements as potassium 5.5 prior to discharge. Please note that we have given Kayexalate prior to discharge. Check potassium tomorrow 05/24/2012  and restart potassium supplements however with less frequet, instead of 20 mEq 3 times a day start 20 mEq daily.   Discharge Diagnoses:  Principal Problem:   Sepsis Active Problems:   Urinary tract infection   Leukocytosis   Hypokalemia   Thrombocytopenia   Dementia   Depression   Hyperglycemia   Discharge Condition: Medically stable for discharge to skilled nursing facility today  Diet recommendation: As tolerated  History of present illness:  72 y.o. female with a past medical history of dementia, polio, and hypertension who was sent from her skilled nursing home to the hospital on 05/20/2012 for evaluation of worsening confusion and fever. Upon initial evaluation emergency department, the patient was slightly hypotensive with a Tmax of 100.4 F. Urinalysis revealed white blood cells and she was referred for admission for treatment of sepsis related to a UTI.  Assessment/Plan:   Principal Problem:  *Sepsis secondary to a urinary tract infection  - Secondary to UTI Escherichia coli. We will discontinue vancomycin and Rocephin. Continue Macrobid in a nursing home for 4 days, 100 mg twice daily. - Pro calcitonin and lactic acid were WNL  - Blood cultures negative to date  Active Problems:  Hyperglycemia / hypoglycemia  - Hemoglobin A1c 4.9%  Leukocytosis  - White blood cell count now WNL  Hypokalemia  - Replaced. Magnesium is within normal limits.  - Potassium today 5.5 secondary to potassium  supplementations. Recommendation was to stop potassium supplements until after her potassium rechecked in a nursing home and then can restart however with less frequency, potassium chloride 20 mEq daily instead of 3 times a day. - Kayexalate one dose given prior to discharge. Thrombocytopenia  - Maybe from sepsis. Baseline platelet count not currently known. - platelet count is trending up, 114 today - no signs of bleeding   Dementia  - Continue Namenda and Aricept.  Depression  -Continue Seroquel and Zoloft.   Code Status: Full.  Family Communication: Florinda Marker [phone]); family not at the bedside today  Medical Consultants:  None. Other Consultants:  Physical therapy Anti-infectives:  Rocephin 05/20/2012---> 05/23/2012 Vancomycin 05/20/2012---> 5/14/20143 Macrobid 05/20/2012 - for 4 days on discharge  Discharge Exam: Filed Vitals:   05/23/12 0500  BP: 134/74  Pulse: 63  Temp: 97.9 F (36.6 C)  Resp: 18   Filed Vitals:   05/22/12 1411 05/22/12 1710 05/22/12 2100 05/23/12 0500  BP: 93/54 129/71 141/64 134/74  Pulse: 68 55 75 63  Temp: 97.9 F (36.6 C) 97.3 F (36.3 C) 98.8 F (37.1 C) 97.9 F (36.6 C)  TempSrc: Axillary Axillary Oral Oral  Resp: 20 18 18 18   Height:      Weight:    83.008 kg (183 lb)  SpO2: 97%  97% 98%    General: Pt is alert, follows commands appropriately, not in acute distress Cardiovascular: Regular rate and rhythm, S1/S2 +, no murmurs, no rubs, no gallops Respiratory: Clear to auscultation bilaterally, no wheezing, no crackles, no rhonchi Abdominal: Soft, non tender, non distended, bowel sounds +, no guarding Extremities: no edema, no cyanosis, pulses palpable  bilaterally DP and PT Neuro: Grossly nonfocal  Discharge Instructions  Discharge Orders   Future Orders Complete By Expires     Call MD for:  difficulty breathing, headache or visual disturbances  As directed     Call MD for:  persistant dizziness or light-headedness  As  directed     Call MD for:  persistant nausea and vomiting  As directed     Call MD for:  severe uncontrolled pain  As directed     Diet - low sodium heart healthy  As directed     Discharge instructions  As directed     Comments:      1. please take Macrobid 100 mg twice daily for 4 days on discharge for urinary tract infection. 2. please stop take potassium supplements as potassium 5.5 prior to discharge. Please note that we have given Kayexalate prior to discharge. Check potassium tomorrow 05/24/2012  and restart potassium supplements however with less frequet, instead of 20 mEq 3 times a day start 20 mEq daily.    Increase activity slowly  As directed         Medication List    STOP taking these medications       potassium chloride SA 20 MEQ tablet  Commonly known as:  K-DUR,KLOR-CON      TAKE these medications       acetaminophen 325 MG tablet  Commonly known as:  TYLENOL  Take 325 mg by mouth every 6 (six) hours as needed for pain (fever).     calcium-vitamin D 500-200 MG-UNIT per tablet  Commonly known as:  OSCAL WITH D  Take 1 tablet by mouth daily.     divalproex 125 MG DR tablet  Commonly known as:  DEPAKOTE  Take 125 mg by mouth 3 (three) times daily.     donepezil 5 MG tablet  Commonly known as:  ARICEPT  Take 5 mg by mouth every evening.     Ensure Plus Liqd  Take 237 mLs by mouth daily.     furosemide 40 MG tablet  Commonly known as:  LASIX  Take 40 mg by mouth daily.     gabapentin 100 MG capsule  Commonly known as:  NEURONTIN  Take 100 mg by mouth daily.     LORazepam 0.5 MG tablet  Commonly known as:  ATIVAN  Take 0.5 tablets (0.25 mg total) by mouth 2 (two) times daily.     memantine 5 MG tablet  Commonly known as:  NAMENDA  Take 5 mg by mouth 2 (two) times daily.     metolazone 2.5 MG tablet  Commonly known as:  ZAROXOLYN  Take 2.5 mg by mouth daily.     nitrofurantoin (macrocrystal-monohydrate) 100 MG capsule  Commonly known as:   MACROBID  Take 1 capsule (100 mg total) by mouth 2 (two) times daily.     QUEtiapine 50 MG tablet  Commonly known as:  SEROQUEL  Take 1 tablet (50 mg total) by mouth 2 (two) times daily.     sertraline 50 MG tablet  Commonly known as:  ZOLOFT  Take 1 tablet (50 mg total) by mouth daily.           Follow-up Information   Follow up with Florentina Jenny, MD In 2 weeks.   Contact information:   [address] [place] [address] [phone]        The results of significant diagnostics from this hospitalization (including imaging, microbiology, ancillary and  laboratory) are listed below for reference.    Significant Diagnostic Studies: Ct Head Wo Contrast 05/20/2012   *  IMPRESSION: No acute intracranial abnormality.  Atrophy, chronic microvascular disease.   Original Report Authenticated By: Charlett Nose, M.D.   Dg Chest Port 1 View 05/20/2012   * IMPRESSION: Mild cardiomegaly, vascular congestion.   Original Report Authenticated By: Charlett Nose, M.D.    Microbiology: Recent Results (from the past 240 hour(s))  URINE CULTURE     Status: None   Collection Time    05/20/12 10:45 PM      Result Value Range Status   Specimen Description URINE, CATHETERIZED   Final   Special Requests NONE   Final   Culture  Setup Time 05/21/2012 02:22   Final   Colony Count >=100,000 COLONIES/ML   Final   Culture ESCHERICHIA COLI   Final   Report Status 05/22/2012 FINAL   Final   Organism ID, Bacteria ESCHERICHIA COLI   Final  CULTURE, BLOOD (ROUTINE X 2)     Status: None   Collection Time    05/20/12 11:42 PM      Result Value Range Status   Specimen Description BLOOD RPFA   Final   Special Requests Normal BOTTLES DRAWN AEROBIC AND ANAEROBIC 5CC   Final   Culture  Setup Time 05/21/2012 10:56   Final   Culture     Final   Value:        BLOOD CULTURE RECEIVED NO GROWTH TO DATE CULTURE WILL BE HELD FOR 5 DAYS BEFORE ISSUING A FINAL NEGATIVE REPORT   Report Status PENDING    Incomplete  CULTURE, BLOOD (ROUTINE X 2)     Status: None   Collection Time    05/20/12 11:47 PM      Result Value Range Status   Specimen Description BLOOD L HAND   Final   Special Requests Normal BOTTLES DRAWN AEROBIC AND ANAEROBIC 5CC   Final   Culture  Setup Time 05/21/2012 10:55   Final   Culture     Final   Value:        BLOOD CULTURE RECEIVED NO GROWTH TO DATE CULTURE WILL BE HELD FOR 5 DAYS BEFORE ISSUING A FINAL NEGATIVE REPORT   Report Status PENDING   Incomplete  MRSA PCR SCREENING     Status: None   Collection Time    05/21/12  1:28 AM      Result Value Range Status   MRSA by PCR NEGATIVE  NEGATIVE Final   Comment:            The GeneXpert MRSA Assay (FDA     approved for NASAL specimens     only), is one component of a     comprehensive MRSA colonization     surveillance program. It is not     intended to diagnose MRSA     infection nor to guide or     monitor treatment for     MRSA infections.     Labs: Basic Metabolic Panel:  Recent Labs Lab 05/20/12 2220 05/21/12 0154 05/21/12 0948 05/22/12 0800 05/23/12 0600  NA 139 139  --  138 136  K 2.6* 2.3* 3.2* 3.9 5.5*  CL 94* 99  --  102 105  CO2  --  33*  --  32 27  GLUCOSE 140* 120*  --  84 78  BUN 29* 25*  --  10 11  CREATININE 0.60 0.35*  --  0.35* 0.28*  CALCIUM  --  7.8*  --  8.0* 8.6  MG  --  2.0  --   --   --   PHOS  --  2.2*  --   --   --    Liver Function Tests:  Recent Labs Lab 05/21/12 0154  AST 20  ALT 9  ALKPHOS 51  BILITOT 0.3  PROT 5.3*  ALBUMIN 2.0*   No results found for this basename: LIPASE, AMYLASE,  in the last 168 hours No results found for this basename: AMMONIA,  in the last 168 hours CBC:  Recent Labs Lab 05/20/12 2210 05/20/12 2220 05/21/12 0154 05/22/12 0800 05/23/12 0600  WBC 16.0*  --  12.1* 7.8 8.6  NEUTROABS 13.6*  --  10.0*  --   --   HGB 12.1 12.6 12.4 11.6* 12.6  HCT 35.8* 37.0 36.4 33.9* 37.1  MCV 90.2  --  91.7 91.1 90.9  PLT 67*  --  74* 90*  114*   Cardiac Enzymes: No results found for this basename: CKTOTAL, CKMB, CKMBINDEX, TROPONINI,  in the last 168 hours BNP: BNP (last 3 results) No results found for this basename: PROBNP,  in the last 8760 hours CBG:  Recent Labs Lab 05/21/12 0747 05/21/12 0819 05/21/12 2008 05/22/12 0340 05/22/12 0748  GLUCAP 48* 63* 80 98 73    Time coordinating discharge: Over 30 minutes  Signed:  Manson Passey, MD  [facility]  05/23/2012, 11:33 AM  Pager #: [phone]

## 2012-05-23 NOTE — Progress Notes (Signed)
Patient is set to discharge back to [facility] today, CSW confirmed with Crystal that they are able to take patient back. Patient's nephew, [name] (ph#: [phone]) made aware. Discharge packet in [place]. [facility] called for transport.   Unice Bailey, LCSW [facility] Clinical Social Worker cell #: [phone]

## 2012-05-23 NOTE — ED Provider Notes (Signed)
  Medical screening examination/treatment/procedure(s) were performed by non-physician practitioner and as supervising physician I was immediately available for consultation/collaboration.  On my exam the patient was in no distress.  She was clearly altered.  She was hypertensive requiring IV fluid resuscitation.  With the multiple medical problems she required admission for further evaluation and management  I saw the ECG (if appropriate), relevant labs and studies - I agree with the interpretation.    Gerhard Munch, MD 05/23/12 [id number]

## 2012-05-27 LAB — CULTURE, BLOOD (ROUTINE X 2)
Culture: NO GROWTH
Special Requests: NORMAL

## 2012-06-18 ENCOUNTER — Encounter (HOSPITAL_COMMUNITY): Payer: Self-pay | Admitting: Emergency Medicine

## 2012-06-18 ENCOUNTER — Inpatient Hospital Stay (HOSPITAL_COMMUNITY): Payer: Medicare Other

## 2012-06-18 ENCOUNTER — Inpatient Hospital Stay (HOSPITAL_COMMUNITY)
Admission: EM | Admit: 2012-06-18 | Discharge: 2012-06-21 | DRG: 640 | Disposition: A | Discharge status: Skilled Nursing Facility | Payer: Medicare Other | Attending: Internal Medicine | Admitting: Internal Medicine

## 2012-06-18 DIAGNOSIS — G9349 Other encephalopathy: Secondary | ICD-10-CM | POA: Diagnosis present

## 2012-06-18 DIAGNOSIS — E440 Moderate protein-calorie malnutrition: Secondary | ICD-10-CM | POA: Diagnosis present

## 2012-06-18 DIAGNOSIS — R4182 Altered mental status, unspecified: Secondary | ICD-10-CM | POA: Diagnosis present

## 2012-06-18 DIAGNOSIS — D696 Thrombocytopenia, unspecified: Secondary | ICD-10-CM

## 2012-06-18 DIAGNOSIS — R627 Adult failure to thrive: Secondary | ICD-10-CM | POA: Diagnosis present

## 2012-06-18 DIAGNOSIS — E876 Hypokalemia: Principal | ICD-10-CM

## 2012-06-18 DIAGNOSIS — D649 Anemia, unspecified: Secondary | ICD-10-CM | POA: Diagnosis present

## 2012-06-18 DIAGNOSIS — I10 Essential (primary) hypertension: Secondary | ICD-10-CM | POA: Diagnosis present

## 2012-06-18 DIAGNOSIS — F0390 Unspecified dementia without behavioral disturbance: Secondary | ICD-10-CM

## 2012-06-18 DIAGNOSIS — Z8612 Personal history of poliomyelitis: Secondary | ICD-10-CM

## 2012-06-18 DIAGNOSIS — N390 Urinary tract infection, site not specified: Secondary | ICD-10-CM

## 2012-06-18 LAB — BASIC METABOLIC PANEL
BUN: 18 mg/dL (ref 6–23)
BUN: 18 mg/dL (ref 6–23)
CO2: 37 mEq/L — ABNORMAL HIGH (ref 19–32)
CO2: 34 mEq/L — ABNORMAL HIGH (ref 19–32)
Calcium: 9.2 mg/dL (ref 8.4–10.5)
Chloride: 92 mEq/L — ABNORMAL LOW (ref 96–112)
Creatinine, Ser: 0.44 mg/dL — ABNORMAL LOW (ref 0.50–1.10)
Glucose, Bld: 114 mg/dL — ABNORMAL HIGH (ref 70–99)
Potassium: 3.5 mEq/L (ref 3.5–5.1)

## 2012-06-18 LAB — URINALYSIS, ROUTINE W REFLEX MICROSCOPIC
Bilirubin Urine: NEGATIVE
Glucose, UA: NEGATIVE mg/dL
Specific Gravity, Urine: 1.017 (ref 1.005–1.030)
Urobilinogen, UA: 1 mg/dL (ref 0.0–1.0)
pH: 6 (ref 5.0–8.0)

## 2012-06-18 LAB — CBC WITH DIFFERENTIAL/PLATELET
Eosinophils Relative: 1 % (ref 0–5)
Lymphs Abs: 1.1 10*3/uL (ref 0.7–4.0)
MCH: 31.3 pg (ref 26.0–34.0)
MCV: 89.6 fL (ref 78.0–100.0)
Monocytes Absolute: 0.9 10*3/uL (ref 0.1–1.0)
Neutro Abs: 2.8 10*3/uL (ref 1.7–7.7)
Platelets: 22 10*3/uL — CL (ref 150–400)
RBC: 4.41 MIL/uL (ref 3.87–5.11)
RDW: 14.6 % (ref 11.5–15.5)

## 2012-06-18 LAB — URINE MICROSCOPIC-ADD ON

## 2012-06-18 LAB — MAGNESIUM: Magnesium: 1.9 mg/dL (ref 1.5–2.5)

## 2012-06-18 MED ORDER — POTASSIUM CHLORIDE 10 MEQ/100ML IV SOLN
10.0000 meq | Freq: Once | INTRAVENOUS | Status: AC
  Administered 2012-06-18: 10 meq via INTRAVENOUS
  Filled 2012-06-18 (×2): qty 100

## 2012-06-18 MED ORDER — ONDANSETRON HCL 4 MG PO TABS: 4.0000 mg | ORAL_TABLET | Freq: Four times a day (QID) | ORAL | Status: DC | PRN

## 2012-06-18 MED ORDER — ACETAMINOPHEN 325 MG PO TABS
325.0000 mg | ORAL_TABLET | Freq: Four times a day (QID) | ORAL | Status: DC | PRN
  Administered 2012-06-18: 325 mg via ORAL
  Filled 2012-06-18: qty 1

## 2012-06-18 MED ORDER — LORAZEPAM 2 MG/ML IJ SOLN: 1.0000 mg | Freq: Once | INTRAMUSCULAR | Status: DC

## 2012-06-18 MED ORDER — POTASSIUM CHLORIDE 20 MEQ/15ML (10%) PO LIQD
60.0000 meq | Freq: Once | ORAL | Status: AC
  Administered 2012-06-18: 60 meq via ORAL
  Filled 2012-06-18: qty 45

## 2012-06-18 MED ORDER — HYDROCODONE-ACETAMINOPHEN 5-325 MG PO TABS: 1.0000 | ORAL_TABLET | ORAL | Status: DC | PRN

## 2012-06-18 MED ORDER — QUETIAPINE FUMARATE 50 MG PO TABS
50.0000 mg | ORAL_TABLET | Freq: Two times a day (BID) | ORAL | Status: DC
  Administered 2012-06-18 – 2012-06-21 (×6): 50 mg via ORAL
  Filled 2012-06-18 (×7): qty 1

## 2012-06-18 MED ORDER — DIVALPROEX SODIUM 125 MG PO DR TAB
125.0000 mg | DELAYED_RELEASE_TABLET | Freq: Three times a day (TID) | ORAL | Status: DC
  Administered 2012-06-18 – 2012-06-21 (×8): 125 mg via ORAL
  Filled 2012-06-18 (×11): qty 1

## 2012-06-18 MED ORDER — ONDANSETRON HCL 4 MG/2ML IJ SOLN: 4.0000 mg | Freq: Four times a day (QID) | INTRAMUSCULAR | Status: DC | PRN

## 2012-06-18 MED ORDER — SODIUM CHLORIDE 0.9 % IJ SOLN
3.0000 mL | Freq: Two times a day (BID) | INTRAMUSCULAR | Status: DC
  Administered 2012-06-19 – 2012-06-21 (×3): 3 mL via INTRAVENOUS

## 2012-06-18 MED ORDER — POTASSIUM CHLORIDE 10 MEQ/100ML IV SOLN
10.0000 meq | INTRAVENOUS | Status: AC
  Administered 2012-06-18 (×5): 10 meq via INTRAVENOUS
  Filled 2012-06-18 (×4): qty 100

## 2012-06-18 MED ORDER — DONEPEZIL HCL 5 MG PO TABS
5.0000 mg | ORAL_TABLET | Freq: Every evening | ORAL | Status: DC
  Administered 2012-06-18 – 2012-06-20 (×3): 5 mg via ORAL
  Filled 2012-06-18 (×5): qty 1

## 2012-06-18 MED ORDER — METOLAZONE 2.5 MG PO TABS
2.5000 mg | ORAL_TABLET | Freq: Every day | ORAL | Status: DC
  Administered 2012-06-19: 2.5 mg via ORAL
  Filled 2012-06-18: qty 1

## 2012-06-18 MED ORDER — MEMANTINE HCL 5 MG PO TABS
5.0000 mg | ORAL_TABLET | Freq: Two times a day (BID) | ORAL | Status: DC
  Administered 2012-06-18 – 2012-06-21 (×6): 5 mg via ORAL
  Filled 2012-06-18 (×8): qty 1

## 2012-06-18 MED ORDER — LACTATED RINGERS IV SOLN
INTRAVENOUS | Status: AC
  Administered 2012-06-18 – 2012-06-19 (×2): via INTRAVENOUS

## 2012-06-18 MED ORDER — ENSURE PLUS PO LIQD
237.0000 mL | Freq: Every day | ORAL | Status: DC
  Administered 2012-06-19: 237 mL via ORAL
  Filled 2012-06-18 (×2): qty 237

## 2012-06-18 MED ORDER — LORAZEPAM 0.5 MG PO TABS
0.2500 mg | ORAL_TABLET | Freq: Two times a day (BID) | ORAL | Status: DC
  Administered 2012-06-18 – 2012-06-21 (×6): 0.25 mg via ORAL
  Filled 2012-06-18 (×6): qty 1

## 2012-06-18 MED ORDER — SERTRALINE HCL 50 MG PO TABS
50.0000 mg | ORAL_TABLET | Freq: Every day | ORAL | Status: DC
  Administered 2012-06-19 – 2012-06-21 (×3): 50 mg via ORAL
  Filled 2012-06-18 (×4): qty 1

## 2012-06-18 MED ORDER — FUROSEMIDE 40 MG PO TABS
40.0000 mg | ORAL_TABLET | Freq: Every day | ORAL | Status: DC
  Administered 2012-06-19: 40 mg via ORAL
  Filled 2012-06-18: qty 1

## 2012-06-18 MED ORDER — GABAPENTIN 100 MG PO CAPS
100.0000 mg | ORAL_CAPSULE | Freq: Every day | ORAL | Status: DC
  Administered 2012-06-19 – 2012-06-21 (×3): 100 mg via ORAL
  Filled 2012-06-18 (×4): qty 1

## 2012-06-18 NOTE — Progress Notes (Addendum)
Patient not allowing nurse to perform the neuro check. She will not open her eyes or squeeze the nurse's hands. She is not being as cooperative compared to earlier. Patient constantly asking the nurse why she is doing everything. All she wants right now is the mitts off. She keeps saying "I don't care." Patient is resting comfortably right now, RN will continue to monitor her mental status.

## 2012-06-18 NOTE — ED Provider Notes (Addendum)
History     CSN: [id number]  Arrival date & time 06/18/12  1629   First MD Initiated Contact with Patient 06/18/12 1647      Chief Complaint  Patient presents with  . Abnormal Lab    (Consider location/radiation/quality/duration/timing/severity/associated sxs/prior treatment) HPI Comments: LEVEL 5 CAVEAT FOR PROFOUND DENENTIA  72 y.o. female with a past medical history of dementia, polio, and hypertension comes in with cc of low potassium. Per Nursing home, patient was found to have a K of 2.2. Pt has no complains, specifically, denies any palpitations, muscle cramps. No diarrhea, nausea, emesis. Recent admission - noted to be hypokalemic at admission, put on K supplement, but d/c'd as she was hyperkalemic at that time.  The history is provided by the patient.    Past Medical History  Diagnosis Date  . Dementia   . Hypertension   . Heart murmur   . Polio   . Thrombocytopenia   . V-tach     History reviewed. No pertinent past surgical history.  No family history on file.  History  Substance Use Topics  . Smoking status: Unknown If Ever Smoked  . Smokeless tobacco: Not on file  . Alcohol Use: No    OB History   Grav Para Term Preterm Abortions TAB SAB Ect Mult Living                  Review of Systems  Unable to perform ROS: Dementia    Allergies  Review of patient's allergies indicates no known allergies.  Home Medications   Current Outpatient Rx  Name  Route  Sig  Dispense  Refill  . acetaminophen (TYLENOL) 325 MG tablet   Oral   Take 325 mg by mouth every 6 (six) hours as needed for pain.         . calcium-vitamin D (OSCAL WITH D) 500-200 MG-UNIT per tablet   Oral   Take 1 tablet by mouth daily.         . divalproex (DEPAKOTE) 125 MG DR tablet   Oral   Take 125 mg by mouth 3 (three) times daily.         [name] donepezil (ARICEPT) 5 MG tablet   Oral   Take 5 mg by mouth every evening.         . Ensure Plus (ENSURE PLUS) LIQD   Oral   Take  237 mLs by mouth daily.         . furosemide (LASIX) 40 MG tablet   Oral   Take 40 mg by mouth daily.         [name] gabapentin (NEURONTIN) 100 MG capsule   Oral   Take 100 mg by mouth daily.         [name] LORazepam (ATIVAN) 0.5 MG tablet   Oral   Take 0.5 tablets (0.25 mg total) by mouth 2 (two) times daily.   30 tablet   0   . memantine (NAMENDA) 5 MG tablet   Oral   Take 5 mg by mouth 2 (two) times daily.         . metolazone (ZAROXOLYN) 2.5 MG tablet   Oral   Take 2.5 mg by mouth daily.         . potassium chloride SA (K-DUR,KLOR-CON) 20 MEQ tablet   Oral   Take 40 mEq by mouth 3 (three) times daily.         . QUEtiapine (SEROQUEL) 50 MG tablet   Oral  Take 1 tablet (50 mg total) by mouth 2 (two) times daily.   30 tablet   0   . sertraline (ZOLOFT) 50 MG tablet   Oral   Take 1 tablet (50 mg total) by mouth daily.   30 tablet   0     BP 135/68  Pulse 80  Temp(Src) 99.5 F (37.5 C) (Oral)  Resp 20  SpO2 94%  Physical Exam  Nursing note and vitals reviewed. Constitutional: She appears well-developed and well-nourished.  HENT:  Head: Normocephalic and atraumatic.  Eyes: EOM are normal. Pupils are equal, round, and reactive to light.  Neck: Neck supple.  Cardiovascular: Normal rate, regular rhythm and normal heart sounds.   No murmur heard. Pulmonary/Chest: Effort normal. No respiratory distress.  Abdominal: Soft. She exhibits no distension. There is no tenderness. There is no rebound and no guarding.  Neurological: She is alert.  Skin: Skin is warm and dry.    ED Course  Procedures (including critical care time)  Labs Reviewed  CBC WITH DIFFERENTIAL  BASIC METABOLIC PANEL  MAGNESIUM   No results found.   No diagnosis found.    MDM   Date: 06/18/2012  Rate: 73  Rhythm: normal sinus rhythm  QRS Axis: normal  Intervals: normal  ST/T Wave abnormalities: nonspecific ST/T changes  Conduction Disutrbances:none  Narrative  Interpretation:   Old EKG Reviewed: NEW U waves   Pt with dementia sent to the ED with cc of hypokalemia. Pt has profound hypokalemia as per outside records. No GI loss. Besides lasix 40 mg, no other meds that i can think off directly that might be causing this.  Will repeat labs, get ekg. Pt appears to be asymptomatic, but she is also a poor historian.   Derwood Kaplan, MD 06/18/12 1755  5:55 PM K is 2.1 - and thus patient indeed has profound hypokalemia - with some ekg changes as below. Mag is wnl U waves on ekg. Will start potassium supplement PO and IV. will admit.  Patient also has critical platelet count of 22. Rest of CBC is fine. Not sure why she has thrombocytopenia. We will get CT if Hospitalist request - but there is no focal neuro deficit at this time, nor is patient c/o pain.  CRITICAL CARE Performed by: Derwood Kaplan   Total critical care time: 30 minutes  Critical care time was exclusive of separately billable procedures and treating other patients.  Critical care was necessary to treat or prevent imminent or life-threatening deterioration.  Critical care was time spent personally by me on the following activities: development of treatment plan with patient and/or surrogate as well as nursing, discussions with consultants, evaluation of patient's response to treatment, examination of patient, obtaining history from patient or surrogate, ordering and performing treatments and interventions, ordering and review of laboratory studies, ordering and review of radiographic studies, pulse oximetry and re-evaluation of patient's condition.   Derwood Kaplan, MD 06/18/12 1758  Derwood Kaplan, MD 06/18/12 8119  Derwood Kaplan, MD 06/18/12 1478

## 2012-06-18 NOTE — H&P (Signed)
[facility] History and Physical  Carolyn Ford JYN:[id number] DOB: 1940/10/24 DOA: 06/18/2012  Referring physician: ED physician PCP: Florentina Jenny, MD   Chief Complaint: altered mental status   HPI:  72 y.o. female with a past medical history of dementia, polio, and hypertension who was sent from her skilled nursing home to the hospital for evaluation of worsening confusion and lethargy noted one day prior to admission. Upon initial evaluation in emergency department, the patient was found to have hypokalemia with K 2.1, she was unable to provide history a nd no clear history from SNF available at the time of admission. [facility] asked to admit for further evaluation and management. nO family at bedside to provide history. Pt was last discharged form the hospital 05/23/2012 after being hospitalized for urosepsis.  Assessment and Plan:  Principal Problem:   Altered mental state - unclear etiology at this time, will admit pt to telemetry bed due to hypokalemia - will supplement potassium via IV route and will repeat BMP tonight and once again in AM - will also check Mg level  - as pt unable to provide history will check UA and CXR Active Problems:   Hypokalemia - secondary to Lasix most likely and Zaroxolyn - will supplement as noted above, repeat BMP tonight and again in AM   Thrombocytopenia - unclear etiology and worse since discharge 05/23/2012 - no signs of active bleed - will hold DVT prophylaxis Lovenox or heparin and will temporarily provide SCD's - SCD's in AM   Dementia - progressive, bed rest for now and plan on ambulating if possible once pt more medically stable   Moderate malnutrition - secondary to progressive dementia and deconditioning, failure to thrive - nutritional supplementation   Code Status: Full Family Communication: No family at bedside Disposition Plan: Admit to telemetry bed   Review of Systems:  Unable to assess due to altered mental status  Past  Medical History  Diagnosis Date  . Dementia   . Hypertension   . Heart murmur   . Polio   . Thrombocytopenia   . V-tach     Past Surgical History  Procedure Laterality Date  . Tonsillectomy      Social History:  reports that she has never smoked. She has never used smokeless tobacco. She reports that  drinks alcohol. She reports that she does not use illicit drugs.  No Known Allergies  Family history: unable to obtain due to altered mental status  Prior to Admission medications   Medication Sig Start Date End Date Taking? Authorizing Provider  acetaminophen (TYLENOL) 325 MG tablet Take 325 mg by mouth every 6 (six) hours as needed for pain.   Yes Historical Provider, MD  calcium-vitamin D (OSCAL WITH D) 500-200 MG-UNIT per tablet Take 1 tablet by mouth daily.   Yes Historical Provider, MD  divalproex (DEPAKOTE) 125 MG DR tablet Take 125 mg by mouth 3 (three) times daily.   Yes Historical Provider, MD  donepezil (ARICEPT) 5 MG tablet Take 5 mg by mouth every evening.   Yes Historical Provider, MD  Ensure Plus (ENSURE PLUS) LIQD Take 237 mLs by mouth daily.   Yes Historical Provider, MD  furosemide (LASIX) 40 MG tablet Take 40 mg by mouth daily.   Yes Historical Provider, MD  gabapentin (NEURONTIN) 100 MG capsule Take 100 mg by mouth daily.   Yes Historical Provider, MD  LORazepam (ATIVAN) 0.5 MG tablet Take 0.5 tablets (0.25 mg total) by mouth 2 (two) times daily. 05/23/12  Yes  Alison Murray, MD  memantine (NAMENDA) 5 MG tablet Take 5 mg by mouth 2 (two) times daily.   Yes Historical Provider, MD  metolazone (ZAROXOLYN) 2.5 MG tablet Take 2.5 mg by mouth daily.   Yes Historical Provider, MD  potassium chloride SA (K-DUR,KLOR-CON) 20 MEQ tablet Take 40 mEq by mouth 3 (three) times daily.   Yes Historical Provider, MD  QUEtiapine (SEROQUEL) 50 MG tablet Take 1 tablet (50 mg total) by mouth 2 (two) times daily. 05/23/12  Yes Alison Murray, MD  sertraline (ZOLOFT) 50 MG tablet Take 1  tablet (50 mg total) by mouth daily. 05/23/12  Yes Alison Murray, MD    Physical Exam: Filed Vitals:   06/18/12 1630  BP: 135/68  Pulse: 80  Temp: 99.5 F (37.5 C)  TempSrc: Oral  Resp: 16  SpO2: 98%    Physical Exam  Constitutional: Appears somnolent but easy to arouse, not following command appropriately  HENT: Normocephalic. External right and left ear normal. Dry MM Eyes: Conjunctivae and EOM are normal. PERRLA, no scleral icterus.  Neck: Normal ROM. Neck supple. No JVD. No tracheal deviation. No thyromegaly.  CVS: RRR, S1/S2 +, no murmurs, no gallops, no carotid bruit.  Pulmonary: Effort and breath sounds normal, no stridor, decreased breath sounds at bases  Abdominal: Soft. BS +,  no distension, tenderness, rebound or guarding.  Musculoskeletal: Normal range of motion. No edema and no tenderness.  Lymphadenopathy: No lymphadenopathy noted, cervical, inguinal. Neuro: Somnolent, moving all 4 extremities spontaneously  Skin: Skin is warm and dry. No rash noted. Not diaphoretic. No erythema. No pallor.  Psychiatric: Unable to assess due to altered mental status   Labs on Admission:  Basic Metabolic Panel:  Recent Labs Lab 06/18/12 1645  NA 134*  K 2.1*  CL 91*  CO2 37*  GLUCOSE 92  BUN 18  CREATININE 0.44*  CALCIUM 9.2  MG 1.9   CBC:  Recent Labs Lab 06/18/12 1645  WBC 5.0  NEUTROABS 2.8  HGB 13.8  HCT 39.5  MCV 89.6  PLT 22*    Radiological Exams on Admission: No results found.  EKG: Normal sinus rhythm, no ST/T wave changes  Debbora Presto, MD  [facility] Pager [phone]  If 7PM-7AM, please contact night-coverage [email or web address] Password [facility] 06/18/2012, 6:29 PM

## 2012-06-18 NOTE — ED Notes (Signed)
PER EMS-pt c/o of hypokalemia, recent lab 2.2. Hx Dementia. CBG 112.

## 2012-06-18 NOTE — Progress Notes (Signed)
Utilization Review completed.  Crit Obremski RN CM  

## 2012-06-18 NOTE — Progress Notes (Signed)
Patient started to pull off her heart monitor leads. RN and nurse tech went in and replaced leads going over the importance of keeping the leads on. Just a few minutes later, patient pulls leads off again. Placed mitts on the patients to prevent her from pulling on her IV or leads. Patient is very confused, RN will continue to monitor.

## 2012-06-19 DIAGNOSIS — R4182 Altered mental status, unspecified: Secondary | ICD-10-CM

## 2012-06-19 LAB — BASIC METABOLIC PANEL
CO2: 36 mEq/L — ABNORMAL HIGH (ref 19–32)
Calcium: 8.4 mg/dL (ref 8.4–10.5)
Chloride: 95 mEq/L — ABNORMAL LOW (ref 96–112)
GFR calc Af Amer: >90 mL/min (ref >90)
Sodium: 133 mEq/L — ABNORMAL LOW (ref 135–145)

## 2012-06-19 LAB — CBC
HCT: 34.4 % — ABNORMAL LOW (ref 36.0–46.0)
MCV: 90.1 fL (ref 78.0–100.0)
Platelets: 31 10*3/uL — ABNORMAL LOW (ref 150–400)
RBC: 3.82 MIL/uL — ABNORMAL LOW (ref 3.87–5.11)
WBC: 4.6 10*3/uL (ref 4.0–10.5)

## 2012-06-19 LAB — MAGNESIUM: Magnesium: 2 mg/dL (ref 1.5–2.5)

## 2012-06-19 MED ORDER — SODIUM CHLORIDE 0.9 % IV SOLN
INTRAVENOUS | Status: DC
  Administered 2012-06-19 – 2012-06-20 (×2): via INTRAVENOUS
  Filled 2012-06-19 (×3): qty 1000

## 2012-06-19 MED ORDER — ENSURE COMPLETE PO LIQD
237.0000 mL | Freq: Every day | ORAL | Status: DC
  Administered 2012-06-20: 237 mL via ORAL

## 2012-06-19 MED ORDER — POTASSIUM CHLORIDE CRYS ER 20 MEQ PO TBCR
40.0000 meq | EXTENDED_RELEASE_TABLET | Freq: Once | ORAL | Status: AC
  Administered 2012-06-19: 40 meq via ORAL
  Filled 2012-06-19: qty 2

## 2012-06-19 MED ORDER — POTASSIUM CHLORIDE 10 MEQ/100ML IV SOLN
10.0000 meq | INTRAVENOUS | Status: AC
  Administered 2012-06-19 (×4): 10 meq via INTRAVENOUS
  Filled 2012-06-19 (×4): qty 100

## 2012-06-19 NOTE — Care Management Note (Addendum)
    Page 1 of 2   06/21/2012     12:19:53 PM   CARE MANAGEMENT NOTE 06/21/2012  Patient:  Carolyn Ford, Carolyn Ford   Account Number:  [id number]  Date Initiated:  06/19/2012  Documentation initiated by:  Lanier Clam  Subjective/Objective Assessment:   ADMITTED W/AMS.     Action/Plan:   FROM [facility]   Anticipated DC Date:  06/22/2012   Anticipated DC Plan:  ASSISTED LIVING / REST HOME      DC Planning Services  CM consult      Choice offered to / List presented to:          [facility] arranged  HH-1 RN  HH-2 PT  HH-3 OT      [facility] agency  [facility]   Status of service:  Completed, signed off Medicare Important Message given?  NA - LOS <3 / Initial given by admissions (If response is "NO", the following Medicare IM given date fields will be blank) Date Medicare IM given:   Date Additional Medicare IM given:    Discharge Disposition:  HOME W HOME HEALTH SERVICES  Per UR Regulation:  Reviewed for med. necessity/level of care/duration of stay  If discussed at Long Length of Stay Meetings, dates discussed:    Comments:  06/21/12 Shaundra Fullam RN,BSN NCM [phone] TC [facility] [phone],SPOKE TO CRYSTAL HARRISON WHO CONTRACTS WITH [facility] FOR HH.ORDERED FOR HHRN/PT/OT.THEY HAVE IN HOUSE MD THAT MAKES 1XWEEK VISITS.DR.TAAVONI,& PA LAURA BETH MORAITIS WHO HAS SEEN PATIENT.THEY WILL MANAGE PATIENT WHILE @[facility].CRYSTAL IS AWARE OF PATIENT & SAID THAT FAMILY DO NOT COME TO VISIT @ [facility] EITHER,NURSE FORPATIENT MARY GRACE SAIDTHATNO ONE HAS COME HERE ALSO.[facility] CRYSTALSAIDTHEY MAKE DECISIONS IN THE PATIENT BEHALF.TC [facility] REPMARY ANN-WHO IS AWARE OF D/C& HH ORDERS.  06/19/12 Leshonda Galambos RN,BSN NCM [phone] LOW [address]. [place].PT CONS, AWAIT RECOMMENDATIONS.

## 2012-06-19 NOTE — Progress Notes (Signed)
Patient was checked this morning for incontinence and was found to be dry at this time.

## 2012-06-19 NOTE — Evaluation (Signed)
Physical Therapy Evaluation Patient Details Name: Carolyn Ford MRN: [id number] DOB: [birth year] Today's Date: 06/19/2012 Time: 8295-6213 PT Time Calculation (min): 13 min  PT Assessment / Plan / Recommendation Clinical Impression  Pt is 72 yo admitted 06/18/12 with hypokalemia. Pt recently in hospital for UTI> Pt has H/o polio, uses longleg breaces. Chart from previous admission reports pt required 2 person assist with braces for transfers. Braces are in closet with strong urine odor. Pwill benefit from PT to assess transfers. ALF will have to determine ability to continue to provide care if pt will participate.    PT Assessment  Patient needs continued PT services    Follow Up Recommendations  Home health PT (if returns to ALF)    Does the patient have the potential to tolerate intense rehabilitation      Barriers to Discharge        Equipment Recommendations  None recommended by PT    Recommendations for Other Services     Frequency Min 3X/week    Precautions / Restrictions Precautions Precautions: Fall Precaution Comments: h/o polio Required Braces or Orthoses: Other Brace/Splint Other Brace/Splint: Has bil. long leg braces   Pertinent Vitals/Pain BP 115.58     Mobility  Bed Mobility Bed Mobility: Supine to Sit;Sit to Supine Details for Bed Mobility Assistance: attempted to move pt to edge of bed , pt was very drowsy. wghen she did arouse she would no participate and resisted being moved. Transfers Transfers: Not assessed    Exercises     PT Diagnosis: Generalized weakness  PT Problem List: Decreased activity tolerance;Decreased cognition;Decreased mobility PT Treatment Interventions: Functional mobility training;Therapeutic activities;Therapeutic exercise;Patient/family education;Wheelchair mobility training   PT Goals Acute Rehab PT Goals PT Goal Formulation: Patient unable to participate in goal setting Time For Goal Achievement: 07/03/12 Potential to Achieve  Goals: Fair Pt will go Supine/Side to Sit: with supervision PT Goal: Supine/Side to Sit - Progress: Goal set today Pt will go Sit to Supine/Side: with supervision PT Goal: Sit to Supine/Side - Progress: Goal set today Pt will Transfer Bed to Chair/Chair to Bed: with +2 total assist PT Transfer Goal: Bed to Chair/Chair to Bed - Progress: Goal set today Pt will Propel Wheelchair: 51 - 150 feet PT Goal: Propel Wheelchair - Progress: Goal set today  Visit Information  Last PT Received On: 06/19/12 Assistance Needed: +2    Subjective Data  Subjective: I am in [place]. I am not going to do that now. Patient Stated Goal: pt did not participate   Prior Functioning  Home Living Type of Home: Assisted living Prior Function Level of Independence: Needs assistance Needs Assistance: Dressing;Grooming;Toileting;Transfers Comments: per last admit, chart indicated 2 person assist with stand pivot transfer with braces.    Cognition  Cognition Arousal/Alertness: Lethargic Behavior During Therapy: Agitated Overall Cognitive Status: History of cognitive impairments - at baseline    Extremity/Trunk Assessment Right Upper Extremity Assessment RUE ROM/Strength/Tone: Deficits RUE ROM/Strength/Tone Deficits: pt poorly participated, previously UE's WFL Left Upper Extremity Assessment LUE ROM/Strength/Tone: Deficits LUE ROM/Strength/Tone Deficits: same as R Right Lower Extremity Assessment RLE ROM/Strength/Tone: Deficits RLE ROM/Strength/Tone Deficits: flaccid, no active movement at knees, ? if has hip as pt did not participate. RLE Sensation: Deficits Left Lower Extremity Assessment LLE ROM/Strength/Tone: Deficits LLE ROM/Strength/Tone Deficits: same as R LLE Sensation: Deficits   Balance    End of Session PT - End of Session Activity Tolerance: Treatment limited secondary to agitation (lethargic) Patient left: in bed;with call bell/phone within reach;with bed alarm set  Nurse Communication:  Mobility status;Need for lift equipment  GP     Rada Hay 06/19/2012, 4:12 PM  Blanchard Kelch PT [phone]

## 2012-06-19 NOTE — Progress Notes (Addendum)
[facility] PROGRESS NOTE  Robertine Kipper AVW:[id number] DOB: 08-23-1940 DOA: 06/18/2012 PCP: Florentina Jenny, MD   Brief narrative: 72 y/o female with past medical hx of dementia and HTN sent from NH for  lethargy and altered mental status.   Assessment/Plan: Altered mental state  -etiolgoy unclear. Patient quite demented and seems to be returning to her  baseline - supplement potassium IV and po. Mg level wnl. Recheck in am. Monitor in tele.  -  UA and CXR unremarkable.   Active Problems:  Hypokalemia  -possibly  secondary to Lasix and zaroxolyn and poor po intake. Hold diuretics Supplemented with KCl. Stable on daily. Monitoring a.m.  Thrombocytopenia  - unclear etiology and worse since discharge 1 month back. Had platelets in 60s when admitted for sepsis then.   - no signs of active bleed  -Continue SCDs -currently no signs of sepsis. Monitor in am , if persistently low, please obtain a hematology consult.  Dementia  - Severe progressive. As for her nephew whom i spoke with over the phone, patient at baseline he is quite demented.  PT evaluation -Continue Aricept  Moderate malnutrition  - secondary to progressive dementia and failure to thrive - Continue nutritional supplementation   HTN  BP stable. hold BP meds    Code Status: Full ( nephew does not know about her living will) Family Communication: Discussed with nephew Leonette Most over the phone Disposition Plan: monitor in tele. D/c back to NH once k improved and no further decline in MS     Consultants:  None  Procedures:  None  Antibiotics:  None  HPI/Subjective: Patient awake and alert. She is quite confused and seems to be at baseline.  Objective: Filed Vitals:   06/18/12 1934 06/18/12 2109 06/18/12 2114 06/19/12 0612  BP: 105/64  123/63 98/58  Pulse:   63 59  Temp: 98.6 F (37 C)  98.6 F (37 C) 97.5 F (36.4 C)  TempSrc: Oral  Oral Axillary  Resp: 18  18 16   Height:  5\' 7"  (1.702 m)     Weight:  79.3 kg (174 lb 13.2 oz)    SpO2: 99%  100% 98%    Intake/Output Summary (Last 24 hours) at 06/19/12 1332 Last data filed at 06/19/12 0700  Gross per 24 hour  Intake   1825 ml  Output      0 ml  Net   1825 ml   Filed Weights   06/18/12 2109  Weight: 79.3 kg (174 lb 13.2 oz)    Exam:   General:  Elderly female lying in bed in no acute distress sleepy but arousable  HEENT: No pallor, moist oral mucosa  Cardiovascular: Normal S1 and S2, no murmurs rub or gallop  Respiratory: clear to auscultation bilaterally  Abdomen: Soft, nontender, nondistended, bowel sounds present  Musculoskeletal: Warm, no edema  CNS: AAO x1  Data Reviewed: Basic Metabolic Panel:  Recent Labs Lab 06/18/12 1645 06/18/12 1924 06/19/12 0529  NA 134* 133* 133*  K 2.1* 3.5 2.6*  CL 91* 92* 95*  CO2 37* 34* 36*  GLUCOSE 92 114* 84  BUN 18 18 15   CREATININE 0.44* 0.41* 0.44*  CALCIUM 9.2 8.9 8.4  MG 1.9  --  2.0  PHOS  --  2.8  --    Liver Function Tests: No results found for this basename: AST, ALT, ALKPHOS, BILITOT, PROT, ALBUMIN,  in the last 168 hours No results found for this basename: LIPASE, AMYLASE,  in the last 168 hours No  results found for this basename: AMMONIA,  in the last 168 hours CBC:  Recent Labs Lab 06/18/12 1645 06/19/12 0529  WBC 5.0 4.6  NEUTROABS 2.8  --   HGB 13.8 11.8*  HCT 39.5 34.4*  MCV 89.6 90.1  PLT 22* 31*   Cardiac Enzymes: No results found for this basename: CKTOTAL, CKMB, CKMBINDEX, TROPONINI,  in the last 168 hours BNP (last 3 results) No results found for this basename: PROBNP,  in the last 8760 hours CBG:  Recent Labs Lab 06/19/12 0748  GLUCAP 76    No results found for this or any previous visit (from the past 240 hour(s)).   Studies: Dg Chest Port 1 View  06/18/2012   *RADIOLOGY REPORT*  Clinical Data: Fever, altered mental status  PORTABLE CHEST - 1 VIEW  Comparison: 05/20/2012  Findings: Advanced degenerative changes of  bilateral shoulders. Mild cardiomegaly stable.  Lungs clear.  No definite effusion. Atheromatous aorta.  Improvement in vascular congestion.  IMPRESSION:  Stable cardiomegaly.  Improved vascular congestion.   Original Report Authenticated By: D. Andria Rhein, MD    Scheduled Meds: . divalproex  125 mg Oral TID  . donepezil  5 mg Oral QPM  . Ensure Plus  237 mL Oral Daily  . furosemide  40 mg Oral Daily  . gabapentin  100 mg Oral Daily  . [COMPLETED] lactated ringers   Intravenous STAT  . LORazepam  1 mg Intravenous Once  . LORazepam  0.25 mg Oral BID  . memantine  5 mg Oral BID  . metolazone  2.5 mg Oral Daily  . potassium chloride  10 mEq Intravenous Q1 Hr x 4  . QUEtiapine  50 mg Oral BID  . sertraline  50 mg Oral Daily  . sodium chloride  3 mL Intravenous Q12H   Continuous Infusions:     Time spent: 25 MINUTES    Sriyan Cutting  [facility] [phone]. If 7PM-7AM, please contact night-coverage at [email or web address], password [facility] 06/19/2012, 1:32 PM  LOS: 1 day

## 2012-06-20 DIAGNOSIS — D649 Anemia, unspecified: Secondary | ICD-10-CM | POA: Diagnosis present

## 2012-06-20 DIAGNOSIS — E440 Moderate protein-calorie malnutrition: Secondary | ICD-10-CM | POA: Diagnosis present

## 2012-06-20 LAB — BASIC METABOLIC PANEL
CO2: 32 mEq/L (ref 19–32)
Chloride: 101 mEq/L (ref 96–112)
Creatinine, Ser: 0.4 mg/dL — ABNORMAL LOW (ref 0.50–1.10)
GFR calc Af Amer: >90 mL/min (ref >90)
GFR calc non Af Amer: >90 mL/min (ref >90)
Glucose, Bld: 93 mg/dL (ref 70–99)
Potassium: 3.1 mEq/L — ABNORMAL LOW (ref 3.5–5.1)
Potassium: 3.3 mEq/L — ABNORMAL LOW (ref 3.5–5.1)
Sodium: 135 mEq/L (ref 135–145)
Sodium: 140 mEq/L (ref 135–145)

## 2012-06-20 LAB — CBC
Hemoglobin: 11.6 g/dL — ABNORMAL LOW (ref 12.0–15.0)
MCH: 30 pg (ref 26.0–34.0)
Platelets: 28 10*3/uL — CL (ref 150–400)
RBC: 3.87 MIL/uL (ref 3.87–5.11)
WBC: 5.3 10*3/uL (ref 4.0–10.5)

## 2012-06-20 LAB — GLUCOSE, CAPILLARY: Glucose-Capillary: 102 mg/dL — ABNORMAL HIGH (ref 70–99)

## 2012-06-20 MED ORDER — POTASSIUM CHLORIDE CRYS ER 20 MEQ PO TBCR
40.0000 meq | EXTENDED_RELEASE_TABLET | Freq: Once | ORAL | Status: AC
  Administered 2012-06-20: 40 meq via ORAL
  Filled 2012-06-20: qty 2

## 2012-06-20 MED ORDER — POTASSIUM CHLORIDE CRYS ER 20 MEQ PO TBCR
40.0000 meq | EXTENDED_RELEASE_TABLET | Freq: Two times a day (BID) | ORAL | Status: DC
  Administered 2012-06-20 – 2012-06-21 (×3): 40 meq via ORAL
  Filled 2012-06-20 (×4): qty 2

## 2012-06-20 NOTE — Progress Notes (Signed)
CRITICAL VALUE ALERT  Critical value received:  Platelets 28  Date of notification:  06/20/12  Time of notification:  5:15  Critical value read back: Platelets 28   Nurse who received alert:  Ernesta Amble  MD notified (1st page):  Merdis Delay, NP  Time of first page:  05:40    MD notified (2nd page):  Time of second page:  Responding MD:  Merdis Delay, NP  Time MD responded:  06:10, phone systems were down off and on all morning.

## 2012-06-20 NOTE — Progress Notes (Signed)
[facility] PROGRESS NOTE  Carolyn Ford ZOX:[id number] DOB: 1940-02-01 DOA: 06/18/2012 PCP: Florentina Jenny, MD  Brief narrative: 72 y.o. female with a past medical history of dementia, polio, and hypertension who was sent from her skilled nursing home 06/18/2012 to the [facility] ED for evaluation of worsening confusion and lethargy noted one day prior to this admission. Upon initial evaluation in emergency department, the patient was found to have hypokalemia of  2.1 and platelet count of 22. CXR showed stale cardiomegaly.  Assessment and Plan:   Principal Problem:  Acute encephalopathy - Likely worsening dementia, delirium - No significant changes in mental status. - Patient will return to skilled nursing facility when stable  Active Problems:  Hypokalemia  - secondary to Lasix and Zaroxolyn  - We are continuing to supplement potassium to IV fluids as well as by mouth. Check potassium in a.m. - Magnesium is within normal limits Thrombocytopenia  - unclear etiology. There are no signs of active bleed. - Platelet count on admission 22, subsequently trended up to 31 and today 28 - no signs of active bleed  Anemia - hemoglobin stable at 11.6 - no indications for transfusion  Moderate malnutrition  - secondary to progressive dementia and deconditioning, failure to thrive  - nutritional supplementation   Code Status: Full  Family Communication: No family at bedside  Disposition Plan: To skilled nursing facility when stable   Manson Passey, MD  [facility] Pager [phone]  If 7PM-7AM, please contact night-coverage [email or web address] Password [facility] 06/20/2012, 11:53 AM   LOS: 2 days   Consultants:  None   Procedures:  none  Antibiotics:  None   HPI/Subjective: No acute events overnight.  Objective: Filed Vitals:   06/19/12 0612 06/19/12 1430 06/19/12 2112 06/20/12 0512  BP: 98/58 98/49 114/71 109/65  Pulse: 59 67 71   Temp: 97.5 F (36.4 C) 98.1 F (36.7 C) 97.6 F (36.4 C)  97.8 F (36.6 C)  TempSrc: Axillary Oral Oral Axillary  Resp: 16 16 16 16   Height:      Weight:      SpO2: 98% 99% 99% 98%    Intake/Output Summary (Last 24 hours) at 06/20/12 1153 Last data filed at 06/20/12 1030  Gross per 24 hour  Intake   2448 ml  Output   1225 ml  Net   1223 ml    Exam:   General:  Pt is alert, not in acute distress  Cardiovascular: Regular rate and rhythm, S1/S2 appreciated  Respiratory: Clear to auscultation bilaterally, no wheezing, no crackles, no rhonchi  Abdomen: Soft, non tender, non distended, bowel sounds present, no guarding  Extremities: No edema, pulses DP and PT palpable bilaterally  Neuro: Grossly nonfocal  Data Reviewed: Basic Metabolic Panel:  Recent Labs Lab 06/18/12 1645 06/18/12 1924 06/19/12 0529 06/20/12 0450  NA 134* 133* 133* 135  K 2.1* 3.5 2.6* 3.1*  CL 91* 92* 95* 97  CO2 37* 34* 36* 33*  GLUCOSE 92 114* 84 93  BUN 18 18 15 23   CREATININE 0.44* 0.41* 0.44* 0.51  CALCIUM 9.2 8.9 8.4 8.3*  MG 1.9  --  2.0  --   PHOS  --  2.8  --   --    CBC:  Recent Labs Lab 06/18/12 1645 06/19/12 0529 06/20/12 0450  WBC 5.0 4.6 5.3  NEUTROABS 2.8  --   --   HGB 13.8 11.8* 11.6*  HCT 39.5 34.4* 34.8*  MCV 89.6 90.1 89.9  PLT 22* 31* 28*   CBG:  Recent Labs Lab 06/19/12 0748 06/20/12 0827  GLUCAP 76 102*    Studies: Dg Chest Port 1 View 06/18/2012   * IMPRESSION:  Stable cardiomegaly.  Improved vascular congestion.   Original Report Authenticated By: D. Andria Rhein, MD    Scheduled Meds: . divalproex  125 mg Oral TID  . donepezil  5 mg Oral QPM  . gabapentin  100 mg Oral Daily  . LORazepam  0.25 mg Oral BID  . memantine  5 mg Oral BID  . potassium chloride  40 mEq Oral BID  . QUEtiapine  50 mg Oral BID  . sertraline  50 mg Oral Daily   Continuous Infusions: . sodium chloride 0.9 % 1,000 mL with potassium chloride 40 mEq infusion 75 mL/hr at 06/20/12 9811

## 2012-06-20 NOTE — Progress Notes (Signed)
PT Cancellation Note  Patient Details Name: Carolyn Ford MRN: [id number] DOB: 1940/03/16   Cancelled Treatment:    Reason Eval/Treat Not Completed: Pt would not open eyes/participate with therapy. Will check back another day.    Rebeca Alert, MPT Pager: [phone]

## 2012-06-20 NOTE — Plan of Care (Signed)
Problem: Phase I Progression Outcomes Goal: Voiding-avoid urinary catheter unless indicated Outcome: Completed/Met Date Met:  06/20/12 incontinent

## 2012-06-21 LAB — CBC
HCT: 36.6 % (ref 36.0–46.0)
Hemoglobin: 12.6 g/dL (ref 12.0–15.0)
MCV: 89.9 fL (ref 78.0–100.0)
RBC: 4.07 MIL/uL (ref 3.87–5.11)
WBC: 6.4 10*3/uL (ref 4.0–10.5)

## 2012-06-21 LAB — BASIC METABOLIC PANEL
BUN: 15 mg/dL (ref 6–23)
CO2: 31 mEq/L (ref 19–32)
Chloride: 102 mEq/L (ref 96–112)
Creatinine, Ser: 0.36 mg/dL — ABNORMAL LOW (ref 0.50–1.10)
Glucose, Bld: 82 mg/dL (ref 70–99)
Potassium: 4.2 mEq/L (ref 3.5–5.1)

## 2012-06-21 MED ORDER — FUROSEMIDE 40 MG PO TABS: 20.0000 mg | ORAL_TABLET | Freq: Every day | ORAL | Status: DC

## 2012-06-21 MED ORDER — POTASSIUM CHLORIDE ER 10 MEQ PO TBCR: 20.0000 meq | EXTENDED_RELEASE_TABLET | Freq: Two times a day (BID) | ORAL | Status: DC

## 2012-06-21 NOTE — Clinical Social Work Psychosocial (Unsigned)
     Clinical Social Work Department BRIEF PSYCHOSOCIAL ASSESSMENT 06/21/2012  Patient:  Carolyn Ford, Carolyn Ford     Account Number:  [id number]     Admit date:  06/18/2012  Clinical Social Worker:  Hattie Perch  Date/Time:  06/21/2012 12:00 M  Referred by:  Physician  Date Referred:  06/21/2012 Referred for  ALF Placement   Other Referral:   Interview type:   Other interview type:    PSYCHOSOCIAL DATA Living Status:  FACILITY Admitted from facility:  [facility] [place] Level of care:  Assisted Living Primary support name:  brenda hair Primary support relationship to patient:  FRIEND Degree of support available:   poor    CURRENT CONCERNS Current Concerns  Post-Acute Placement   Other Concerns:    SOCIAL WORK ASSESSMENT / PLAN Patient is confused and unable to participate in assessment. CSW tried several times to call emergency contact several times with no answer. [facility] confirms that patient is a long term resident and they can accept patient back.   Assessment/plan status:   Other assessment/ plan:   Information/referral to community resources:    PATIENTS/FAMILYS RESPONSE TO PLAN OF CARE: patient to go back to [facility] today. packet copied and placed in [facility]. ptar called for transportation.

## 2012-06-21 NOTE — Discharge Summary (Signed)
Physician Discharge Summary  Carolyn Ford GUY:[id number] DOB: 1940/05/11 DOA: 06/18/2012  PCP: Florentina Jenny, MD  Admit date: 06/18/2012 Discharge date: 06/21/2012  Recommendations for Outpatient Follow-up:   Please note that the patient was on Lasix 40 mg daily which may have contributed to ongoing hypokalemia. We will reduce the dosage to 20 mg a day and we will provide supplemental potassium, K-Dur 20 mEq daily. Please recheck potassium every 3-4 days for next 1-2 weeks to make sure her potassium is within normal limits.  Please continue to follow platelet count at least once a week to make sure upward trend. Please note as indicated below a platelet count  is trending up and prior to discharge it is 46. There are no signs of active bleed. Patient did not require platelet transfusions while in the hospital.  Recommendation is also to followup kidney function on a regular basis considering patient is on a low-dose Lasix. While in the hospital kidney function remains normal.  Discharge Diagnoses:  Principal Problem:   Altered mental state Active Problems:   Hypokalemia   Thrombocytopenia   Dementia   Anemia   Moderate protein-calorie malnutrition   Discharge Condition: Medically stable to return to assisted living facility today  Diet recommendation: As tolerated  History of present illness:  72 y.o. female with a past medical history of dementia, polio, and hypertension who was sent from her skilled nursing home 06/18/2012 to the [facility] ED for evaluation of worsening confusion and lethargy noted one day prior to this admission. Upon initial evaluation in emergency department, the patient was found to have hypokalemia of 2.1 and platelet count of 22. CXR showed stale cardiomegaly.   Assessment and Plan:   Principal Problem:  Acute encephalopathy  - Likely worsening dementia, delirium  - No significant changes in mental status.  - Continue Namenda and Aricept  Active Problems:   Hypokalemia  - secondary to Lasix and Zaroxolyn  - We have discontinued IV fluids with potassium supplement and have supplemented potassium with IV and PO regimen only to prevent fluid overload.  - Potassium is now within normal limits. - Magnesium is within normal limits  - Please note that the patient can continue taking Zaroxolyn and patient can take Lasix although we have decreased the dose to 20 mg a day. Since potassium is within normal limits we will continue potassium supplement 20 mEq twice daily considering we have decreased the Lasix dosage. Thrombocytopenia  - unclear etiology. There are no signs of active bleed.  - Platelet count on admission 22, subsequently trended up to 31 and today 46 - no signs of active bleed  Anemia  - hemoglobin stable at 11.6 and WNL today - no indications for transfusion  Moderate malnutrition  - secondary to progressive dementia and deconditioning, failure to thrive  - nutritional supplementation   Code Status: Full  Family Communication: No family at bedside    Manson Passey, MD  [facility]  Pager [phone]   Consultants:  None  Procedures:  none Antibiotics:  None    Discharge Exam: Filed Vitals:   06/21/12 0540  BP: 133/62  Pulse: 59  Temp: 98.6 F (37 C)  Resp: 18   Filed Vitals:   06/20/12 0512 06/20/12 1330 06/20/12 2119 06/21/12 0540  BP: 109/65 102/56 105/58 133/62  Pulse:  67 68 59  Temp: 97.8 F (36.6 C) 98.4 F (36.9 C) 98.5 F (36.9 C) 98.6 F (37 C)  TempSrc: Axillary Oral Oral Oral  Resp: 16 16 18  18  Height:      Weight:      SpO2: 98% 98% 99% 98%    General: Pt is alert, follows commands appropriately, not in acute distress Cardiovascular: Regular rate and rhythm, S1/S2 +, no murmurs, no rubs, no gallops Respiratory: Clear to auscultation bilaterally, no wheezing, no crackles, no rhonchi Abdominal: Soft, non tender, non distended, bowel sounds +, no guarding Extremities: no edema, no cyanosis, pulses  palpable bilaterally DP and PT Neuro: Grossly nonfocal  Discharge Instructions  Discharge Orders   Future Orders Complete By Expires     Call MD for:  difficulty breathing, headache or visual disturbances  As directed     Call MD for:  persistant dizziness or light-headedness  As directed     Call MD for:  persistant nausea and vomiting  As directed     Call MD for:  severe uncontrolled pain  As directed     Diet - low sodium heart healthy  As directed     Discharge instructions  As directed     Comments:      Please note that the patient was on Lasix 40 mg daily which may have contributed to ongoing hypokalemia. We will reduce the dosage to 20 mg a day and we will provide supplemental potassium, K-Dur 20 mEq daily. Please recheck potassium every 3-4 days for next 1-2 weeks to make sure her potassium is within normal limits.    Increase activity slowly  As directed         Medication List    TAKE these medications       acetaminophen 325 MG tablet  Commonly known as:  TYLENOL  Take 325 mg by mouth every 6 (six) hours as needed for pain.     calcium-vitamin D 500-200 MG-UNIT per tablet  Commonly known as:  OSCAL WITH D  Take 1 tablet by mouth daily.     divalproex 125 MG DR tablet  Commonly known as:  DEPAKOTE  Take 125 mg by mouth 3 (three) times daily.     donepezil 5 MG tablet  Commonly known as:  ARICEPT  Take 5 mg by mouth every evening.     Ensure Plus Liqd  Take 237 mLs by mouth daily.     furosemide 40 MG tablet  Commonly known as:  LASIX  Take 0.5 tablets (20 mg total) by mouth daily.     gabapentin 100 MG capsule  Commonly known as:  NEURONTIN  Take 100 mg by mouth daily.     LORazepam 0.5 MG tablet  Commonly known as:  ATIVAN  Take 0.5 tablets (0.25 mg total) by mouth 2 (two) times daily.     memantine 5 MG tablet  Commonly known as:  NAMENDA  Take 5 mg by mouth 2 (two) times daily.     metolazone 2.5 MG tablet  Commonly known as:  ZAROXOLYN  Take  2.5 mg by mouth daily.     potassium chloride 10 MEQ tablet  Commonly known as:  K-DUR  Take 2 tablets (20 mEq total) by mouth 2 (two) times daily.        QUEtiapine 50 MG tablet  Commonly known as:  SEROQUEL  Take 1 tablet (50 mg total) by mouth 2 (two) times daily.     sertraline 50 MG tablet  Commonly known as:  ZOLOFT  Take 1 tablet (50 mg total) by mouth daily.           Follow-up Information  Follow up with Florentina Jenny, MD In 1 week. (please recheck potassium adn kidney function)    Contact information:   [address] [place] [address] [phone]        The results of significant diagnostics from this hospitalization (including imaging, microbiology, ancillary and laboratory) are listed below for reference.    Significant Diagnostic Studies: Dg Chest Port 1 View  06/18/2012   *RADIOLOGY REPORT*  Clinical Data: Fever, altered mental status  PORTABLE CHEST - 1 VIEW  Comparison: 05/20/2012  Findings: Advanced degenerative changes of bilateral shoulders. Mild cardiomegaly stable.  Lungs clear.  No definite effusion. Atheromatous aorta.  Improvement in vascular congestion.  IMPRESSION:  Stable cardiomegaly.  Improved vascular congestion.   Original Report Authenticated By: D. Andria Rhein, MD    Microbiology: No results found for this or any previous visit (from the past 240 hour(s)).   Labs: Basic Metabolic Panel:  Recent Labs Lab 06/18/12 1645 06/18/12 1924 06/19/12 0529 06/20/12 0450 06/20/12 1729 06/21/12 0545  NA 134* 133* 133* 135 140 138  K 2.1* 3.5 2.6* 3.1* 3.3* 4.2  CL 91* 92* 95* 97 101 102  CO2 37* 34* 36* 33* 32 31  GLUCOSE 92 114* 84 93 102* 82  BUN 18 18 15 23 18 15   CREATININE 0.44* 0.41* 0.44* 0.51 0.40* 0.36*  CALCIUM 9.2 8.9 8.4 8.3* 9.1 8.7  MG 1.9  --  2.0  --   --   --   PHOS  --  2.8  --   --   --   --    Liver Function Tests: No results found for this basename: AST, ALT, ALKPHOS, BILITOT, PROT, ALBUMIN,  in  the last 168 hours No results found for this basename: LIPASE, AMYLASE,  in the last 168 hours No results found for this basename: AMMONIA,  in the last 168 hours CBC:  Recent Labs Lab 06/18/12 1645 06/19/12 0529 06/20/12 0450 06/21/12 0545  WBC 5.0 4.6 5.3 6.4  NEUTROABS 2.8  --   --   --   HGB 13.8 11.8* 11.6* 12.6  HCT 39.5 34.4* 34.8* 36.6  MCV 89.6 90.1 89.9 89.9  PLT 22* 31* 28* 46*   Cardiac Enzymes: No results found for this basename: CKTOTAL, CKMB, CKMBINDEX, TROPONINI,  in the last 168 hours BNP: BNP (last 3 results) No results found for this basename: PROBNP,  in the last 8760 hours CBG:  Recent Labs Lab 06/19/12 0748 06/20/12 0827 06/21/12 0915  GLUCAP 76 102* 92    Time coordinating discharge: Over 30 minutes  Signed:  Manson Passey, MD  [facility]  06/21/2012, 10:56 AM  Pager #: [phone]

## 2012-06-21 NOTE — Progress Notes (Signed)
Patient discharge to [facility], alert not on any distress. PIV removed no s/s of swelling or infiltration noted on insertion site. Report given to [facility] LPN.

## 2013-02-27 ENCOUNTER — Emergency Department (HOSPITAL_COMMUNITY): Payer: Medicare Other

## 2013-02-27 ENCOUNTER — Encounter (HOSPITAL_COMMUNITY): Payer: Self-pay | Admitting: Emergency Medicine

## 2013-02-27 ENCOUNTER — Inpatient Hospital Stay (HOSPITAL_COMMUNITY)
Admission: EM | Admit: 2013-02-27 | Discharge: 2013-03-04 | DRG: 072 | Disposition: A | Discharge status: Skilled Nursing Facility | Payer: Medicare Other | Attending: Internal Medicine | Admitting: Internal Medicine

## 2013-02-27 DIAGNOSIS — Z79899 Other long term (current) drug therapy: Secondary | ICD-10-CM

## 2013-02-27 DIAGNOSIS — G8220 Paraplegia, unspecified: Secondary | ICD-10-CM | POA: Diagnosis present

## 2013-02-27 DIAGNOSIS — R680 Hypothermia, not associated with low environmental temperature: Secondary | ICD-10-CM | POA: Diagnosis present

## 2013-02-27 DIAGNOSIS — R000 Tachycardia, unspecified: Secondary | ICD-10-CM | POA: Diagnosis present

## 2013-02-27 DIAGNOSIS — I499 Cardiac arrhythmia, unspecified: Secondary | ICD-10-CM

## 2013-02-27 DIAGNOSIS — I472 Ventricular tachycardia: Secondary | ICD-10-CM

## 2013-02-27 DIAGNOSIS — R4182 Altered mental status, unspecified: Secondary | ICD-10-CM | POA: Diagnosis present

## 2013-02-27 DIAGNOSIS — I4729 Other ventricular tachycardia: Secondary | ICD-10-CM

## 2013-02-27 DIAGNOSIS — I10 Essential (primary) hypertension: Secondary | ICD-10-CM | POA: Diagnosis present

## 2013-02-27 DIAGNOSIS — G9340 Encephalopathy, unspecified: Principal | ICD-10-CM | POA: Diagnosis present

## 2013-02-27 DIAGNOSIS — D649 Anemia, unspecified: Secondary | ICD-10-CM

## 2013-02-27 DIAGNOSIS — D696 Thrombocytopenia, unspecified: Secondary | ICD-10-CM | POA: Diagnosis present

## 2013-02-27 DIAGNOSIS — F0390 Unspecified dementia without behavioral disturbance: Secondary | ICD-10-CM | POA: Diagnosis present

## 2013-02-27 DIAGNOSIS — E162 Hypoglycemia, unspecified: Secondary | ICD-10-CM | POA: Diagnosis present

## 2013-02-27 DIAGNOSIS — Z8612 Personal history of poliomyelitis: Secondary | ICD-10-CM

## 2013-02-27 HISTORY — DX: Paraplegia, unspecified: G82.20

## 2013-02-27 LAB — CBC WITH DIFFERENTIAL/PLATELET
Basophils Absolute: 0 10*3/uL (ref 0.0–0.1)
Basophils Relative: 0 % (ref 0–1)
EOS PCT: 1 % (ref 0–5)
Eosinophils Absolute: 0.1 10*3/uL (ref 0.0–0.7)
HEMATOCRIT: 41.5 % (ref 36.0–46.0)
HEMOGLOBIN: 14.1 g/dL (ref 12.0–15.0)
LYMPHS ABS: 1.8 10*3/uL (ref 0.7–4.0)
LYMPHS PCT: 26 % (ref 12–46)
MCH: 32 pg (ref 26.0–34.0)
MCHC: 34 g/dL (ref 30.0–36.0)
MCV: 94.3 fL (ref 78.0–100.0)
MONO ABS: 0.6 10*3/uL (ref 0.1–1.0)
Monocytes Relative: 9 % (ref 3–12)
NEUTROS ABS: 4.6 10*3/uL (ref 1.7–7.7)
Neutrophils Relative %: 65 % (ref 43–77)
Platelets: 118 10*3/uL — ABNORMAL LOW (ref 150–400)
RBC: 4.4 MIL/uL (ref 3.87–5.11)
RDW: 13.7 % (ref 11.5–15.5)
WBC: 7.1 10*3/uL (ref 4.0–10.5)

## 2013-02-27 LAB — COMPREHENSIVE METABOLIC PANEL
ALT: 8 U/L (ref 0–35)
AST: 12 U/L (ref 0–37)
Albumin: 3.2 g/dL — ABNORMAL LOW (ref 3.5–5.2)
Alkaline Phosphatase: 74 U/L (ref 39–117)
BILIRUBIN TOTAL: 0.4 mg/dL (ref 0.3–1.2)
BUN: 32 mg/dL — ABNORMAL HIGH (ref 6–23)
CALCIUM: 9.9 mg/dL (ref 8.4–10.5)
CHLORIDE: 102 meq/L (ref 96–112)
CO2: 27 meq/L (ref 19–32)
CREATININE: 0.48 mg/dL — AB (ref 0.50–1.10)
GLUCOSE: 74 mg/dL (ref 70–99)
Potassium: 4.3 mEq/L (ref 3.7–5.3)
Sodium: 139 mEq/L (ref 137–147)
Total Protein: 6.6 g/dL (ref 6.0–8.3)

## 2013-02-27 LAB — GLUCOSE, CAPILLARY
GLUCOSE-CAPILLARY: 66 mg/dL — AB (ref 70–99)
GLUCOSE-CAPILLARY: 88 mg/dL (ref 70–99)
Glucose-Capillary: 97 mg/dL (ref 70–99)
Glucose-Capillary: 97 mg/dL (ref 70–99)

## 2013-02-27 LAB — URINALYSIS, ROUTINE W REFLEX MICROSCOPIC
BILIRUBIN URINE: NEGATIVE
Glucose, UA: NEGATIVE mg/dL
HGB URINE DIPSTICK: NEGATIVE
Ketones, ur: NEGATIVE mg/dL
Leukocytes, UA: NEGATIVE
NITRITE: NEGATIVE
PROTEIN: NEGATIVE mg/dL
SPECIFIC GRAVITY, URINE: 1.017 (ref 1.005–1.030)
UROBILINOGEN UA: 1 mg/dL (ref 0.0–1.0)
pH: 7 (ref 5.0–8.0)

## 2013-02-27 LAB — AMMONIA: Ammonia: 15 umol/L (ref 11–60)

## 2013-02-27 LAB — TROPONIN I: Troponin I: <0.3 ng/mL (ref <0.30)

## 2013-02-27 LAB — MAGNESIUM: Magnesium: 1.8 mg/dL (ref 1.5–2.5)

## 2013-02-27 LAB — PROCALCITONIN: Procalcitonin: <0.1 ng/mL

## 2013-02-27 LAB — CG4 I-STAT (LACTIC ACID): Lactic Acid, Venous: 1.17 mmol/L (ref 0.5–2.2)

## 2013-02-27 LAB — MRSA PCR SCREENING: MRSA BY PCR: POSITIVE — AB

## 2013-02-27 MED ORDER — ACETAMINOPHEN 325 MG PO TABS
325.0000 mg | ORAL_TABLET | Freq: Four times a day (QID) | ORAL | Status: DC | PRN
Start: 2013-02-27 — End: 2013-03-04

## 2013-02-27 MED ORDER — DEXTROSE 50 % IV SOLN
25.0000 mL | Freq: Once | INTRAVENOUS | Status: AC
  Administered 2013-02-27: 25 mL via INTRAVENOUS
  Filled 2013-02-27: qty 50

## 2013-02-27 MED ORDER — DIVALPROEX SODIUM 125 MG PO DR TAB
125.0000 mg | DELAYED_RELEASE_TABLET | Freq: Three times a day (TID) | ORAL | Status: DC
  Filled 2013-02-27: qty 1

## 2013-02-27 MED ORDER — GABAPENTIN 100 MG PO CAPS
100.0000 mg | ORAL_CAPSULE | Freq: Every day | ORAL | Status: DC
  Administered 2013-03-01 – 2013-03-04 (×4): 100 mg via ORAL
  Filled 2013-02-27 (×5): qty 1

## 2013-02-27 MED ORDER — DIVALPROEX SODIUM 250 MG PO DR TAB
250.0000 mg | DELAYED_RELEASE_TABLET | Freq: Three times a day (TID) | ORAL | Status: DC
  Administered 2013-02-27 – 2013-03-04 (×11): 250 mg via ORAL
  Filled 2013-02-27 (×16): qty 1

## 2013-02-27 MED ORDER — DEXTROSE-NACL 5-0.9 % IV SOLN
INTRAVENOUS | Status: DC
  Administered 2013-02-27 – 2013-03-03 (×8): via INTRAVENOUS

## 2013-02-27 MED ORDER — HEPARIN SODIUM (PORCINE) 5000 UNIT/ML IJ SOLN
5000.0000 [IU] | Freq: Three times a day (TID) | INTRAMUSCULAR | Status: DC
  Administered 2013-02-27 – 2013-03-04 (×13): 5000 [IU] via SUBCUTANEOUS
  Filled 2013-02-27 (×17): qty 1

## 2013-02-27 MED ORDER — ONDANSETRON HCL 4 MG/2ML IJ SOLN: 4.0000 mg | Freq: Four times a day (QID) | INTRAMUSCULAR | Status: DC | PRN

## 2013-02-27 MED ORDER — SODIUM CHLORIDE 0.9 % IV BOLUS (SEPSIS)
250.0000 mL | Freq: Once | INTRAVENOUS | Status: AC
  Administered 2013-02-27: 250 mL via INTRAVENOUS

## 2013-02-27 MED ORDER — ONDANSETRON HCL 4 MG PO TABS: 4.0000 mg | ORAL_TABLET | Freq: Four times a day (QID) | ORAL | Status: DC | PRN

## 2013-02-27 NOTE — ED Notes (Signed)
MD at bedside. Hospitalist present evaluating pt for admission. Delay in transport. HR increased 170-200. Monitor showed v-tach. -pt alert and shaking at bs to leave her alone. Pt taping on bed. Pt encouraged to stop shaking. When pt stopped HR decreased to 80. Pt denied CP/SOB- Asked for EKG declined at time. Pt without complaint EMT Cletis AthensJon present. Bare.  hugging present and intact.

## 2013-02-27 NOTE — ED Notes (Signed)
Per [facility]_ [facility] Per staff. Pt was unresponsive. EMS on scene states pt was responded to physical stimuli. Pt reports back pain. HX of Dementia. Staff and pt  denies fall. Pt DX with FLU

## 2013-02-27 NOTE — Consult Note (Signed)
Reason for Consult:Wide QRS tachycardia  Referring Physician: Dr. Kyung Baccaegaldo  Carolyn Ford is an 73 y.o. female.   HPI: The patient is a 73 yo woman with a h/o Dementia, HTN, and recurrent urosepsis who presented today with hypoglycemia, hypotension, and hypothermia. She has no specific complaints although her history does not appear to be very reliable. "Nothing is wrong with me" is her answer to many questions. She was noted to be tremulous and her tele demonstrated an apparent irregular, wide QRS tachycardia. She is referred for additional evaluation. There is a h/o "VT" which is not well characterized. I do not see any recent evaluation of LV function.  PMH: Past Medical History  Diagnosis Date  . Dementia   . Hypertension   . Heart murmur   . Polio   . Thrombocytopenia   . V-tach   . Chronic paraplegia     PSHX: Past Surgical History  Procedure Laterality Date  . Tonsillectomy      FAMHX:History reviewed. No pertinent family history.  Social History:  reports that she has never smoked. She has never used smokeless tobacco. She reports that she drinks alcohol. She reports that she does not use illicit drugs.  Allergies: No Known Allergies  Medications: reviewed  Ct Head Wo Contrast  02/27/2013   CLINICAL DATA:  Unresponsive. Altered mental status. History of dementia.  EXAM: CT HEAD WITHOUT CONTRAST  TECHNIQUE: Contiguous axial images were obtained from the base of the skull through the vertex without intravenous contrast.  COMPARISON:  05/20/2012  FINDINGS: There is no evidence for acute hemorrhage, hydrocephalus, mass lesion, or abnormal extra-axial fluid collection. No definite CT evidence for acute infarction. Diffuse loss of parenchymal volume is consistent with atrophy. Patchy low attenuation in the deep hemispheric and periventricular white matter is nonspecific, but likely reflects chronic microvascular ischemic demyelination.  Hypoplastic left sphenoid sinus shows  chronic opacification. There is been interval progression of circumferential mucosal disease in the right sphenoid sinus. The remaining visualized paranasal sinuses and mastoid air cells are clear.  IMPRESSION: Stable CT evaluation of the brain. There is atrophy with chronic small vessel white matter ischemic demyelination. No acute findings.  Interval progression of sphenoid sinusitis.   Electronically Signed   By: Kennith CenterEric  Mansell M.D.   On: 02/27/2013 14:09   Dg Chest Portable 1 View  02/27/2013   CLINICAL DATA:  Altered mental status.  EXAM: PORTABLE CHEST - 1 VIEW  COMPARISON:  06/18/2012  FINDINGS: Mild cardiomegaly with normal mediastinal contours. No acute infiltrate or edema. No effusion or pneumothorax. No acute osseous findings. 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  IMPRESSION: No active disease.   Electronically Signed   By: Tiburcio PeaJonathan  Watts M.D.   On: 02/27/2013 13:56    ROS  As stated in the HPI and negative for all other systems.  Physical Exam  Vitals:Blood pressure 115/78, pulse 72, temperature 96.6 F (35.9 C), temperature source Rectal, resp. rate 17, SpO2 100.00%.  Chronically ill appearing [age] yo woman, tremulous, who looks older than her stated age, but in NAD HEENT: Unremarkable Neck:  6 cm JVD, no thyromegally Back:  No CVA tenderness Lungs:  Clear except for scattered rales HEART:  Regular rate rhythm, no murmurs, no rubs, no clicks Abd:  Flat, positive bowel sounds, no organomegally, no rebound, no guarding Ext:  2 plus pulses, no edema, no cyanosis, no clubbing Skin:  No rashes no nodules Neuro:  CN II through XII intact, motor grossly intact  Tele - strips reviewed. She has NSR with noise artifact generated by her tremor which looks like PMVT/VF but is only artifact.   Assessment/Plan: 1.Wide QRS tachycardia - fortunately, this represents noise artifact due to her tremor. I  would observe on tele. 2. Hypothermia/hypoglycemia/ ill appearing Rec: agree with hospitalization. Her clinical presentation is worrisome for early sepsis. Would admit, obtain blood cultures, and consider 2D echo as she has not had an echo documented in several yrs in our system.  Sharlot Gowda TaylorMD 02/27/2013, 4:58 PM

## 2013-02-27 NOTE — ED Notes (Signed)
CG4 lactic acid given to Dr. Criss AlvineGoldston

## 2013-02-27 NOTE — ED Notes (Signed)
Cardiology present states NOT VTACH. Hospitalist reports ok for transport to ICU at this time

## 2013-02-27 NOTE — ED Provider Notes (Signed)
CSN: [id number]     Arrival date & time 02/27/13  1227 History   First MD Initiated Contact with Patient 02/27/13 1239     Chief Complaint  Patient presents with  . Altered Mental Status  . Back Pain     (Consider location/radiation/quality/duration/timing/severity/associated sxs/prior Treatment) HPI Comments: 73 yo female transferred from [facility] for altered mental status. Patient is demented, unable to give history. The nurse from the NH tells me that patient was unresponsive and lethargic today. Per her report patient was only arousable to sternal rub. Per the nurse the patient has not been known to be ill recently. No vomiting, fevers or cough. Is on tamiflu as a precaution because multiple patients in NH have the flu but she has been asymptomatic. Her normal baseline is alert but altered. When asked about patient's back pain reported by EMS, she states the patient has chronic back pain but has not seemed to be worse recently.    Past Medical History  Diagnosis Date  . Dementia   . Hypertension   . Heart murmur   . Polio   . Thrombocytopenia   . V-tach   . Chronic paraplegia    Past Surgical History  Procedure Laterality Date  . Tonsillectomy     No family history on file. History  Substance Use Topics  . Smoking status: Never Smoker   . Smokeless tobacco: Never Used  . Alcohol Use: Yes     Comment: occasional wine   OB History   Grav Para Term Preterm Abortions TAB SAB Ect Mult Living                 Review of Systems  Unable to perform ROS: Dementia      Allergies  Review of patient's allergies indicates no known allergies.  Home Medications   Current Outpatient Rx  Name  Route  Sig  Dispense  Refill  . acetaminophen (TYLENOL) 325 MG tablet   Oral   Take 325 mg by mouth every 6 (six) hours as needed for pain.         . calcium-vitamin D (OSCAL WITH D) 500-200 MG-UNIT per tablet   Oral   Take 1 tablet by mouth daily.         . divalproex  (DEPAKOTE) 125 MG DR tablet   Oral   Take 125 mg by mouth 3 (three) times daily.         [name] donepezil (ARICEPT) 5 MG tablet   Oral   Take 5 mg by mouth every evening.         . Ensure Plus (ENSURE PLUS) LIQD   Oral   Take 237 mLs by mouth daily.         . furosemide (LASIX) 40 MG tablet   Oral   Take 0.5 tablets (20 mg total) by mouth daily.   30 tablet   0   . gabapentin (NEURONTIN) 100 MG capsule   Oral   Take 100 mg by mouth daily.         [name] LORazepam (ATIVAN) 0.5 MG tablet   Oral   Take 0.5 tablets (0.25 mg total) by mouth 2 (two) times daily.   30 tablet   0   . memantine (NAMENDA) 5 MG tablet   Oral   Take 5 mg by mouth 2 (two) times daily.         . metolazone (ZAROXOLYN) 2.5 MG tablet   Oral   Take 2.5 mg by  mouth daily.         . potassium chloride (K-DUR) 10 MEQ tablet   Oral   Take 2 tablets (20 mEq total) by mouth 2 (two) times daily.   30 tablet   0   . QUEtiapine (SEROQUEL) 50 MG tablet   Oral   Take 1 tablet (50 mg total) by mouth 2 (two) times daily.   30 tablet   0   . sertraline (ZOLOFT) 50 MG tablet   Oral   Take 1 tablet (50 mg total) by mouth daily.   30 tablet   0    BP 124/64  Pulse 53  Temp(Src) 96.4 F (35.8 C) (Rectal)  Resp 17  SpO2 100% Physical Exam  Nursing note and vitals reviewed. Constitutional: She appears well-developed and well-nourished.  HENT:  Head: Normocephalic and atraumatic.  Right Ear: External ear normal.  Left Ear: External ear normal.  Nose: Nose normal.  Eyes: Right eye exhibits no discharge. Left eye exhibits no discharge.  Cardiovascular: Normal rate, regular rhythm and normal heart sounds.   Pulmonary/Chest: Effort normal and breath sounds normal. She has no wheezes. She has no rales.  Abdominal: Soft. There is no tenderness.  Neurological: She is alert. She is disoriented. GCS eye subscore is 4. GCS verbal subscore is 4. GCS motor subscore is 6.  Skin: Skin is warm and dry.     ED Course  Procedures (including critical care time) Labs Review Labs Reviewed  CBC WITH DIFFERENTIAL - Abnormal; Notable for the following:    Platelets 118 (*)    All other components within normal limits  COMPREHENSIVE METABOLIC PANEL - Abnormal; Notable for the following:    BUN 32 (*)    Creatinine, Ser 0.48 (*)    Albumin 3.2 (*)    All other components within normal limits  GLUCOSE, CAPILLARY - Abnormal; Notable for the following:    Glucose-Capillary 66 (*)    All other components within normal limits  CULTURE, BLOOD (ROUTINE X 2)  CULTURE, BLOOD (ROUTINE X 2)  MRSA PCR SCREENING  URINALYSIS, ROUTINE W REFLEX MICROSCOPIC  AMMONIA  TROPONIN I  MAGNESIUM  GLUCOSE, CAPILLARY  PROCALCITONIN  INFLUENZA PANEL BY PCR (TYPE A & B, H1N1)  TROPONIN I  TROPONIN I  TSH  CORTISOL  COMPREHENSIVE METABOLIC PANEL  CBC  CG4 I-STAT (LACTIC ACID)   Imaging Review Ct Head Wo Contrast  02/27/2013   CLINICAL DATA:  Unresponsive. Altered mental status. History of dementia.  EXAM: CT HEAD WITHOUT CONTRAST  TECHNIQUE: Contiguous axial images were obtained from the base of the skull through the vertex without intravenous contrast.  COMPARISON:  05/20/2012  FINDINGS: There is no evidence for acute hemorrhage, hydrocephalus, mass lesion, or abnormal extra-axial fluid collection. No definite CT evidence for acute infarction. Diffuse loss of parenchymal volume is consistent with atrophy. Patchy low attenuation in the deep hemispheric and periventricular white matter is nonspecific, but likely reflects chronic microvascular ischemic demyelination.  Hypoplastic left sphenoid sinus shows chronic opacification. There is been interval progression of circumferential mucosal disease in the right sphenoid sinus. The remaining visualized paranasal sinuses and mastoid air cells are clear.  IMPRESSION: Stable CT evaluation of the brain. There is atrophy with chronic small vessel white matter ischemic  demyelination. No acute findings.  Interval progression of sphenoid sinusitis.   Electronically Signed   By: Kennith CenterEric  Mansell M.D.   On: 02/27/2013 14:09   Dg Chest Portable 1 View  02/27/2013   CLINICAL DATA:  Altered mental status.  EXAM: PORTABLE CHEST - 1 VIEW  COMPARISON:  06/18/2012  FINDINGS: Mild cardiomegaly with normal mediastinal contours. No acute infiltrate or edema. No effusion or pneumothorax. No acute osseous findings. 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  IMPRESSION: No active disease.   Electronically Signed   By: Tiburcio Pea M.D.   On: 02/27/2013 13:56      MDM   Final diagnoses:  Altered mental status  Encephalopathy    Patient is awake and alert to self and place. Mildly hypoglycemic, but as she is awake and answers questions I doubt this is cause of her prior lethargy. Is hypothermic here, but no obvious source of infection. Due to amount of her lethargy from before, she will need admission to be watched and have further AMS w/u.    Audree Camel, MD 02/27/13 [phone]

## 2013-02-27 NOTE — ED Notes (Signed)
MD at bedside. Cardiology present [facility]

## 2013-02-27 NOTE — H&P (Addendum)
[facility] History and Physical  Carolyn Ford UUV:[id number] DOB: 1941-01-08 DOA: 02/27/2013  Referring physician: Dr Lawrence Marseilles.  PCP: Florentina Jenny, MD   Chief Complaint: AMS.   HPI: Carolyn Ford is a 73 y.o. female with PMH significant for chronic paraplegia, Polio, Hypertension, H/O VT who was brought to ED from nursing due to AMS. Per report patient was very difficult to wake, she was unresponsive, she didn't respond to sternal rub. She was notice to be more confused by SNF staff the day prior to admission. On her way to ED she wake up.  In the ED, she was found to hypothermic, hypoglycemic. CT head negative for acute bleed.  During my evaluation patient tremorous at time. She was alert, following command. Her Hr at time increased to 170 to 200. She was alert , no complaints at that time. No chest pain, dyspnea. Her hr decreased to 60 to 90.  She received IV fluids, half amp of D 50.     Review of Systems:  Negative except as per HPI.   Past Medical History  Diagnosis Date  . Dementia   . Hypertension   . Heart murmur   . Polio   . Thrombocytopenia   . V-tach   . Chronic paraplegia    Past Surgical History  Procedure Laterality Date  . Tonsillectomy     Social History:  reports that she has never smoked. She has never used smokeless tobacco. She reports that she drinks alcohol. She reports that she does not use illicit drugs.  No Known Allergies  History reviewed. No pertinent family history.   Prior to Admission medications   Medication Sig Start Date End Date Taking? Authorizing Provider  acetaminophen (TYLENOL) 325 MG tablet Take 325 mg by mouth every 6 (six) hours as needed for pain.    Historical Provider, MD  calcium-vitamin D (OSCAL WITH D) 500-200 MG-UNIT per tablet Take 1 tablet by mouth daily.    Historical Provider, MD  divalproex (DEPAKOTE) 125 MG DR tablet Take 125 mg by mouth 3 (three) times daily.    Historical Provider, MD  donepezil (ARICEPT)  5 MG tablet Take 5 mg by mouth every evening.    Historical Provider, MD  Ensure Plus (ENSURE PLUS) LIQD Take 237 mLs by mouth daily.    Historical Provider, MD  furosemide (LASIX) 40 MG tablet Take 0.5 tablets (20 mg total) by mouth daily. 06/21/12   Alison Murray, MD  gabapentin (NEURONTIN) 100 MG capsule Take 100 mg by mouth daily.    Historical Provider, MD  LORazepam (ATIVAN) 0.5 MG tablet Take 0.5 tablets (0.25 mg total) by mouth 2 (two) times daily. 05/23/12   Alison Murray, MD  memantine (NAMENDA) 5 MG tablet Take 5 mg by mouth 2 (two) times daily.    Historical Provider, MD  metolazone (ZAROXOLYN) 2.5 MG tablet Take 2.5 mg by mouth daily.    Historical Provider, MD  potassium chloride (K-DUR) 10 MEQ tablet Take 2 tablets (20 mEq total) by mouth 2 (two) times daily. 06/21/12   Alison Murray, MD  QUEtiapine (SEROQUEL) 50 MG tablet Take 1 tablet (50 mg total) by mouth 2 (two) times daily. 05/23/12   Alison Murray, MD  sertraline (ZOLOFT) 50 MG tablet Take 1 tablet (50 mg total) by mouth daily. 05/23/12   Alison Murray, MD   Physical Exam: Filed Vitals:   02/27/13 1524  BP: 126/53  Pulse: 56  Temp:   Resp: 14  BP 126/53  Pulse 56  Temp(Src) 96.6 F (35.9 C) (Rectal)  Resp 14  SpO2 97%  General:  Appears calm and comfortable Eyes: PERRL, normal lids, irises & conjunctiva ENT: grossly normal hearing, lips & tongue Neck: no LAD, masses or thyromegaly Cardiovascular: RRR, no m/r/g. No LE edema. Telemetry: bradycardia, HR increase to 150 to 170 at times. PVC Respiratory: CTA bilaterally, no w/r/r. Normal respiratory effort. Abdomen: soft, ntnd Skin: no rash or induration seen on limited exam Musculoskeletal: decrease tone lower extremities.  Neurologic: Alert, following command, able to move upper extremities, difficulty moving lower extremities. She thought initially she was at home.           Labs on Admission:  Basic Metabolic Panel:  Recent Labs Lab 02/27/13 1300  NA  139  K 4.3  CL 102  CO2 27  GLUCOSE 74  BUN 32*  CREATININE 0.48*  CALCIUM 9.9   Liver Function Tests:  Recent Labs Lab 02/27/13 1300  AST 12  ALT 8  ALKPHOS 74  BILITOT 0.4  PROT 6.6  ALBUMIN 3.2*   No results found for this basename: LIPASE, AMYLASE,  in the last 168 hours  Recent Labs Lab 02/27/13 1300  AMMONIA 15   CBC:  Recent Labs Lab 02/27/13 1300  WBC 7.1  NEUTROABS 4.6  HGB 14.1  HCT 41.5  MCV 94.3  PLT 118*   Cardiac Enzymes: No results found for this basename: CKTOTAL, CKMB, CKMBINDEX, TROPONINI,  in the last 168 hours  BNP (last 3 results) No results found for this basename: PROBNP,  in the last 8760 hours CBG:  Recent Labs Lab 02/27/13 1312  GLUCAP 66*    Radiological Exams on Admission: Ct Head Wo Contrast  02/27/2013   CLINICAL DATA:  Unresponsive. Altered mental status. History of dementia.  EXAM: CT HEAD WITHOUT CONTRAST  TECHNIQUE: Contiguous axial images were obtained from the base of the skull through the vertex without intravenous contrast.  COMPARISON:  05/20/2012  FINDINGS: There is no evidence for acute hemorrhage, hydrocephalus, mass lesion, or abnormal extra-axial fluid collection. No definite CT evidence for acute infarction. Diffuse loss of parenchymal volume is consistent with atrophy. Patchy low attenuation in the deep hemispheric and periventricular white matter is nonspecific, but likely reflects chronic microvascular ischemic demyelination.  Hypoplastic left sphenoid sinus shows chronic opacification. There is been interval progression of circumferential mucosal disease in the right sphenoid sinus. The remaining visualized paranasal sinuses and mastoid air cells are clear.  IMPRESSION: Stable CT evaluation of the brain. There is atrophy with chronic small vessel white matter ischemic demyelination. No acute findings.  Interval progression of sphenoid sinusitis.   Electronically Signed   By: Kennith Center M.D.   On: 02/27/2013  14:09   Dg Chest Portable 1 View  02/27/2013   CLINICAL DATA:  Altered mental status.  EXAM: PORTABLE CHEST - 1 VIEW  COMPARISON:  06/18/2012  FINDINGS: Mild cardiomegaly with normal mediastinal contours. No acute infiltrate or edema. No effusion or pneumothorax. No acute osseous findings. 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  IMPRESSION: No active disease.   Electronically Signed   By: Tiburcio Pea M.D.   On: 02/27/2013 13:56    EKG: Independently reviewed. Initial EKG sinus Bradycardia.   Assessment/Plan Active Problems:   Altered mental status   Encephalopathy  1-Encephalopathy, AMS; unclear etiology, hypoglycemia ? Hypothermia. Ct head negative for acute bleed. Check TSH. Consider MRI  if work up unrevealing. Chest x ray and UA negative for infections.   2-Non sustain VT: IV fluids. Cycle troponin, check ECHO. Mg level. Cardiology consulted. Unclear if brady-tachy syndrome. Correction of hypoglycemia and hypothermia.   3-Hypoglycemia; unclear etiology. Not on oral hypoglycemic agents. Patient is hypothermic. Will order cortisol level. IV fluids D 5 NS. cbg every 2 hours.   4-Hypothermia; bear hugger. Blood culture to check for infection.  5-Dementia; hold multiple medications in setting of bradycardia.  6-influenza panel ordered; per ED physician patient was on tamiflu prophylaxis. Multiple cases of influeza at her facility. Influenza panel ordered.   Code Status: presume Full Code.  Family Communication: Disposition Plan:   Time spent: 75 minutes.   [facility] LLCREGALADO,Natasha Paulson [facility] Pager [phone]  Dr Ladona Ridgelaylor evaluated telemetry monitor. Episode early today was artifact not VT.

## 2013-02-27 NOTE — ED Notes (Signed)
Pt transported with bare hugger

## 2013-02-27 NOTE — ED Notes (Signed)
Bed: WA04 Expected date:  Expected time:  Means of arrival:  Comments: lethargic

## 2013-02-27 NOTE — ED Notes (Signed)
MD at bedside. 

## 2013-02-27 NOTE — Progress Notes (Signed)
Clinical Social Work Department BRIEF PSYCHOSOCIAL ASSESSMENT 02/27/2013  Patient:  Carolyn Ford,Carolyn Ford     Account Number:  [id number]     Admit date:  02/27/2013  Clinical Social Worker:  Doree AlbeeSTEWART,Gustave Lindeman, LCSWA  Date/Time:  02/27/2013 03:46 PM  Referred by:  CSW  Date Referred:  02/27/2013 Referred for  ALF Placement  SNF Placement   Other Referral:   Interview type:  Family Other interview type:   CSW spoke with the paitent POA Teofilo Podharles [employer].    PSYCHOSOCIAL DATA Living Status:  FAC[facility] [place] Level of care:  Assisted Living Primary support name:  Teofilo PodCharles [employer] Primary support relationship to patient:  FAMILY Degree of support available:   Leonette MostCharles is a POA and appears to be a strong support the the patient. [phone]    CURRENT CONCERNS Current Concerns  Post-Acute Placement   Other Concerns:    SOCIAL WORK ASSESSMENT / PLAN CSW attempted to assess the patient however she is diagnosed with dementia and admitted with altered mental status. At this time patient was unable to engage in assessment.  CSW spoke with Tresa EndoKelly at the ALF that infomed Leonette MostCharles is the counsin and POA. CSW spoke with [name] as he appears to be a strong support for the patient and her wellbeing.  CSW informed Leonette MostCharles of the supports provided by the CSW department with placement upon discharge. CSW discussed with Leonette MostCharles the potential that the patient may need a higher level of care before returning to her current living environment.   Charles informed CSW that the current plan is the [facility]itent to [facility].  CSW answered any questions Leonette MostCharles may have had at this point in the paitent's care.   Assessment/plan status:  Psychosocial Support/Ongoing Assessment of Needs Other assessment/ plan:   Information/referral to community resources:   None identied at this time.    PATIENT'S/FAMILY'S RESPONSE TO PLAN OF CARE: POA  shared his appreciation of CSW supports.  Patient's POA plans for her to return to [facility] upon discharge. POA was informed of potential need for higher level of care before returning to current living envrionment and he expressed his understanding.       Maryelizabeth Rowanressa Corbett, MSW, LSWA  Olga CoasterKristen Alzada [place], [place] [phone]  ED CSW 02/27/2013 4:03pm

## 2013-02-28 DIAGNOSIS — F0390 Unspecified dementia without behavioral disturbance: Secondary | ICD-10-CM

## 2013-02-28 DIAGNOSIS — I499 Cardiac arrhythmia, unspecified: Secondary | ICD-10-CM

## 2013-02-28 DIAGNOSIS — D696 Thrombocytopenia, unspecified: Secondary | ICD-10-CM

## 2013-02-28 DIAGNOSIS — I517 Cardiomegaly: Secondary | ICD-10-CM

## 2013-02-28 LAB — GLUCOSE, CAPILLARY
Glucose-Capillary: 94 mg/dL (ref 70–99)
Glucose-Capillary: 94 mg/dL (ref 70–99)
Glucose-Capillary: 95 mg/dL (ref 70–99)
Glucose-Capillary: 92 mg/dL (ref 70–99)

## 2013-02-28 LAB — COMPREHENSIVE METABOLIC PANEL
ALBUMIN: 2.5 g/dL — AB (ref 3.5–5.2)
ALK PHOS: 59 U/L (ref 39–117)
ALT: 8 U/L (ref 0–35)
AST: 13 U/L (ref 0–37)
BILIRUBIN TOTAL: 0.3 mg/dL (ref 0.3–1.2)
BUN: 19 mg/dL (ref 6–23)
CALCIUM: 8.5 mg/dL (ref 8.4–10.5)
CO2: 23 meq/L (ref 19–32)
Chloride: 105 mEq/L (ref 96–112)
Creatinine, Ser: 0.32 mg/dL — ABNORMAL LOW (ref 0.50–1.10)
GFR calc Af Amer: >90 mL/min (ref >90)
GFR calc non Af Amer: >90 mL/min (ref >90)
Glucose, Bld: 115 mg/dL — ABNORMAL HIGH (ref 70–99)
POTASSIUM: 3.6 meq/L — AB (ref 3.7–5.3)
SODIUM: 139 meq/L (ref 137–147)
Total Protein: 5.3 g/dL — ABNORMAL LOW (ref 6.0–8.3)

## 2013-02-28 LAB — CBC
HCT: 38.7 % (ref 36.0–46.0)
Hemoglobin: 12.9 g/dL (ref 12.0–15.0)
MCH: 31.4 pg (ref 26.0–34.0)
MCHC: 33.3 g/dL (ref 30.0–36.0)
MCV: 94.2 fL (ref 78.0–100.0)
PLATELETS: 91 10*3/uL — AB (ref 150–400)
RBC: 4.11 MIL/uL (ref 3.87–5.11)
RDW: 13.6 % (ref 11.5–15.5)
WBC: 6.4 10*3/uL (ref 4.0–10.5)

## 2013-02-28 LAB — CORTISOL: CORTISOL PLASMA: 12.6 ug/dL

## 2013-02-28 LAB — INFLUENZA PANEL BY PCR (TYPE A & B)
H1N1FLUPCR: NOT DETECTED
INFLBPCR: NEGATIVE
Influenza A By PCR: NEGATIVE

## 2013-02-28 LAB — TSH
TSH: 1.697 u[IU]/mL (ref 0.350–4.500)
TSH: 2.54 u[IU]/mL (ref 0.350–4.500)

## 2013-02-28 LAB — VITAMIN B12: VITAMIN B 12: 1384 pg/mL — AB (ref 211–911)

## 2013-02-28 LAB — TROPONIN I

## 2013-02-28 LAB — RPR: RPR Ser Ql: NONREACTIVE

## 2013-02-28 MED ORDER — SERTRALINE HCL 50 MG PO TABS
50.0000 mg | ORAL_TABLET | Freq: Every day | ORAL | Status: DC
Start: 2013-02-28 — End: 2013-03-04
  Administered 2013-03-01 – 2013-03-04 (×4): 50 mg via ORAL
  Filled 2013-02-28 (×5): qty 1

## 2013-02-28 MED ORDER — DONEPEZIL HCL 5 MG PO TABS
5.0000 mg | ORAL_TABLET | Freq: Every evening | ORAL | Status: DC
  Administered 2013-03-01 – 2013-03-03 (×3): 5 mg via ORAL
  Filled 2013-02-28 (×5): qty 1

## 2013-02-28 MED ORDER — CHLORHEXIDINE GLUCONATE CLOTH 2 % EX PADS
6.0000 | MEDICATED_PAD | Freq: Every day | CUTANEOUS | Status: DC
Start: 2013-03-01 — End: 2013-03-04
  Administered 2013-03-01 – 2013-03-04 (×4): 6 via TOPICAL

## 2013-02-28 MED ORDER — POTASSIUM CHLORIDE CRYS ER 20 MEQ PO TBCR
40.0000 meq | EXTENDED_RELEASE_TABLET | Freq: Two times a day (BID) | ORAL | Status: AC
  Administered 2013-02-28: 40 meq via ORAL
  Filled 2013-02-28: qty 2

## 2013-02-28 MED ORDER — LORAZEPAM 2 MG/ML IJ SOLN
0.5000 mg | Freq: Once | INTRAMUSCULAR | Status: AC
  Administered 2013-02-28: 0.5 mg via INTRAMUSCULAR
  Filled 2013-02-28: qty 1

## 2013-02-28 MED ORDER — LORAZEPAM 2 MG/ML IJ SOLN
0.5000 mg | Freq: Once | INTRAMUSCULAR | Status: AC
  Administered 2013-02-28: 0.5 mg via INTRAMUSCULAR

## 2013-02-28 MED ORDER — MUPIROCIN 2 % EX OINT
1.0000 "application " | TOPICAL_OINTMENT | Freq: Two times a day (BID) | CUTANEOUS | Status: DC
  Administered 2013-02-28 – 2013-03-04 (×8): 1 via NASAL
  Filled 2013-02-28: qty 22

## 2013-02-28 MED ORDER — LORAZEPAM 2 MG/ML IJ SOLN
0.5000 mg | Freq: Three times a day (TID) | INTRAMUSCULAR | Status: DC | PRN
  Administered 2013-02-28 – 2013-03-01 (×3): 0.5 mg via INTRAVENOUS
  Filled 2013-02-28 (×4): qty 1

## 2013-02-28 MED ORDER — LORAZEPAM 0.5 MG PO TABS
0.5000 mg | ORAL_TABLET | Freq: Once | ORAL | Status: AC
Start: 2013-02-28 — End: 2013-02-28

## 2013-02-28 MED ORDER — LORAZEPAM 0.5 MG PO TABS
0.2500 mg | ORAL_TABLET | Freq: Two times a day (BID) | ORAL | Status: DC | PRN
  Filled 2013-02-28: qty 1

## 2013-02-28 MED ORDER — ASPIRIN 81 MG PO CHEW
81.0000 mg | CHEWABLE_TABLET | Freq: Every day | ORAL | Status: DC
  Administered 2013-03-01 – 2013-03-04 (×4): 81 mg via ORAL
  Filled 2013-02-28 (×5): qty 1

## 2013-02-28 NOTE — Progress Notes (Signed)
  Echocardiogram 2D Echocardiogram has been performed.  Carolyn Ford, Carolyn Ford 02/28/2013, 8:23 AM

## 2013-02-28 NOTE — Progress Notes (Signed)
Patient ID: Carolyn Ford, female   DOB: Feb 13, 1940, 73 y.o.   MRN: [id number]    Subjective:  Denies SSCP, palpitations or Dyspnea Seems disoriented   Objective:  Filed Vitals:   02/28/13 0000 02/28/13 0032 02/28/13 0400 02/28/13 0600  BP:  124/66    Pulse:  65 54   Temp: 97.8 F (36.6 C)  97.2 F (36.2 C)   TempSrc: Oral  Oral   Resp:  15 14   Height:    5\' 2"  (1.575 m)  Weight:   175 lb 7.8 oz (79.6 kg)   SpO2:  98% 97%     Intake/Output from previous day:  Intake/Output Summary (Last 24 hours) at 02/28/13 4098 Last data filed at 02/28/13 0600  Gross per 24 hour  Intake 1223.33 ml  Output    600 ml  Net 623.33 ml    Physical Exam: Distant Pale elderly female  HEENT: normal Neck supple with no adenopathy JVP normal no bruits no thyromegaly Lungs clear with no wheezing and good diaphragmatic motion Heart:  S1/S2 no murmur, no rub, gallop or click PMI normal Abdomen: benighn, BS positve, no tenderness, no AAA no bruit.  No HSM or HJR Distal pulses intact with no bruits No edema Neuro non-focal  Upper extremities tremulous  Skin warm and dry No muscular weakness   Lab Results: Basic Metabolic Panel:  Recent Labs  11/91/47 1300 02/27/13 1639 02/28/13 0325  NA 139  --  139  K 4.3  --  3.6*  CL 102  --  105  CO2 27  --  23  GLUCOSE 74  --  115*  BUN 32*  --  19  CREATININE 0.48*  --  0.32*  CALCIUM 9.9  --  8.5  MG  --  1.8  --    Liver Function Tests:  Recent Labs  02/27/13 1300 02/28/13 0325  AST 12 13  ALT 8 8  ALKPHOS 74 59  BILITOT 0.4 0.3  PROT 6.6 5.3*  ALBUMIN 3.2* 2.5*   CBC:  Recent Labs  02/27/13 1300 02/28/13 0325  WBC 7.1 6.4  NEUTROABS 4.6  --   HGB 14.1 12.9  HCT 41.5 38.7  MCV 94.3 94.2  PLT 118* 91*   Cardiac Enzymes:  Recent Labs  02/27/13 1639 02/27/13 2153 02/28/13 0324  TROPONINI <0.30 <0.30 <0.30   Thyroid Function Tests:  Recent Labs  02/27/13 1300  TSH 1.697    Imaging: Ct Head Wo  Contrast  02/27/2013   CLINICAL DATA:  Unresponsive. Altered mental status. History of dementia.  EXAM: CT HEAD WITHOUT CONTRAST  TECHNIQUE: Contiguous axial images were obtained from the base of the skull through the vertex without intravenous contrast.  COMPARISON:  05/20/2012  FINDINGS: There is no evidence for acute hemorrhage, hydrocephalus, mass lesion, or abnormal extra-axial fluid collection. No definite CT evidence for acute infarction. Diffuse loss of parenchymal volume is consistent with atrophy. Patchy low attenuation in the deep hemispheric and periventricular white matter is nonspecific, but likely reflects chronic microvascular ischemic demyelination.  Hypoplastic left sphenoid sinus shows chronic opacification. There is been interval progression of circumferential mucosal disease in the right sphenoid sinus. The remaining visualized paranasal sinuses and mastoid air cells are clear.  IMPRESSION: Stable CT evaluation of the brain. There is atrophy with chronic small vessel white matter ischemic demyelination. No acute findings.  Interval progression of sphenoid sinusitis.   Electronically Signed   By: Kennith Center M.D.   On: 02/27/2013 14:09  Dg Chest Portable 1 View  02/27/2013   CLINICAL DATA:  Altered mental status.  EXAM: PORTABLE CHEST - 1 VIEW  COMPARISON:  06/18/2012  FINDINGS: Mild cardiomegaly with normal mediastinal contours. No acute infiltrate or edema. No effusion or pneumothorax. No acute osseous findings. 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  IMPRESSION: No active disease.   Electronically Signed   By: Tiburcio PeaJonathan  Watts M.D.   On: 02/27/2013 13:56    Cardiac Studies:  ECG:   SR no acute changes artifact    Telemetry:  NSR no VT 02/28/2013   Echo:  pending  Medications:   . divalproex  250 mg Oral TID  . gabapentin  100 mg Oral Daily  . heparin  5,000 Units Subcutaneous 3 times per  day     . dextrose 5 % and 0.9% NaCl 100 mL/hr at 02/28/13 0346    Assessment/Plan:  Arrhythmia:  No documentation of significant arrhythmia  See not by Dr Ladona Ridgelaylor Artifact from tremor.  Can d/c telemetry in 24 hours Altered  MS:  W/u ongoing CT ok    Will sign off   Charlton Hawseter Isam Unrein 02/28/2013, 7:29 AM

## 2013-02-28 NOTE — Evaluation (Signed)
Clinical/Bedside Swallow Evaluation Patient Details  Name: Charlene BrookeSuzanne Gagliano MRN: [id number] Date of Birth: 04/02/1940  Today's Date: 02/28/2013 Time: [id number] SLP Time Calculation (min): 16 min  Past Medical History:  Past Medical History  Diagnosis Date  . Dementia   . Hypertension   . Heart murmur   . Polio   . Thrombocytopenia   . V-tach   . Chronic paraplegia    Past Surgical History:  Past Surgical History  Procedure Laterality Date  . Tonsillectomy     HPI:  73 yo woman with a h/o Dementia, HTN, and recurrent urosepsis who presented today with hypoglycemia, hypotension, and hypothermia.  CT negative for acute intracranial process.    Assessment / Plan / Recommendation Clinical Impression  Pt presents with normal oropharyngeal swallow marked by active mastication, swift swallow response, and adequate airway protection.  Rec maintaining regular consistency diet, thin liquids.  Pt able to self-feed; confused with language deficits, but pleasant.  Should be safe to eat without supervision once tray is set up for her.  Recommend removing restraints for meals then reapplying when through.  SLP to sign off.     Aspiration Risk  Mild    Diet Recommendation Regular;Thin liquid   Liquid Administration via: Straw;Cup Medication Administration: Whole meds with liquid Supervision: Patient able to self feed    Other  Recommendations Oral Care Recommendations: Oral care BID   Follow Up Recommendations  None        Swallow Study Prior Functional Status       General HPI: 73 yo woman with a h/o Dementia, HTN, and recurrent urosepsis who presented today with hypoglycemia, hypotension, and hypothermia.  CT negative for acute intracranial process.  Type of Study: Bedside swallow evaluation Previous Swallow Assessment: none per records Diet Prior to this Study: Regular;Thin liquids Temperature Spikes Noted: No Respiratory Status: Room air Behavior/Cognition:  Alert;Cooperative;Confused Oral Cavity - Dentition: Adequate natural dentition Self-Feeding Abilities: Able to feed self Patient Positioning: Upright in bed Baseline Vocal Quality: Clear Volitional Cough: Strong Volitional Swallow: Able to elicit    Oral/Motor/Sensory Function Overall Oral Motor/Sensory Function: Appears within functional limits for tasks assessed   Ice Chips Ice chips: Within functional limits Presentation: Spoon   Thin Liquid Thin Liquid: Within functional limits Presentation: Self Fed;Cup    Nectar Thick Nectar Thick Liquid: Not tested   Honey Thick Honey Thick Liquid: Not tested   Puree Puree: Within functional limits Presentation: Self Fed;Spoon   Solid  Loomis Anacker L. Medan[place]outure, [place] CCC/SLP Pager [phone]     Solid: Within functional limits Presentation: Self Fed       Blenda MountsCouture, Lewin Pellow Laurice 02/28/2013,1:45 PM

## 2013-02-28 NOTE — Progress Notes (Signed)
CARE MANAGEMENT NOTE 02/28/2013  Patient:  Carolyn BrookeORTER,Carolyn   Account Number:  [id number]  Date Initiated:  02/28/2013  Documentation initiated by:  DAVIS,RHONDA  Subjective/Objective Assessment:   pt.confused,combative,hypothermic,placed on bear hugger/pt continues to require restraints -posey belt and mittens-has chewed through iv line and attempted to bite staff several times     Action/Plan:   from [facility] [place]   Anticipated DC Date:  03/03/2013   Anticipated DC Plan:  ASSISTED LIVING / REST HOME  In-house referral  Clinical Social Worker      DC Planning Services  NA      [facility]  NA   Choice offered to / List presented to:  NA   DME arranged  NA      DME agency  NA     HH arranged  NA      HH agency  NA   Status of service:  In process, will continue to follow Medicare Important Message given?  NA - LOS <3 / Initial given by admissions (If response is "NO", the following Medicare IM given date fields will be blank) Date Medicare IM given:   Date Additional Medicare IM given:    Discharge Disposition:    Per UR Regulation:  Reviewed for med. necessity/level of care/duration of stay  If discussed at Long Length of Stay Meetings, dates discussed:    Comments:  [id number]/Rhonda Stark JockDavis, RN, BSN, [place] [phone] Chart Reviewed for discharge and hospital needs. Discharge needs at time of review:  None present will follow for needs. Review of patient progress due on [id number].

## 2013-02-28 NOTE — Progress Notes (Signed)
[facility] PROGRESS NOTE  Carolyn Ford RUE:[id number] DOB: 15-Oct-1940 DOA: 02/27/2013 PCP: Florentina Jenny, MD  Assessment/Plan: 1-Encephalopathy, AMS; unclear etiology, hypoglycemia ? Hypothermia ? [name] Ct head negative for acute bleed. Check TSH, B 12, RPR. Chest x ray and UA negative for infections. MRI ordered to rule out stroke.   2 Questionable Arrhthymias; no evidence of arrythmia. Episode was likely artifact from tremors. Troponin negative, check ECHO pending.  Mg level normal. Appreciate Dr Ladona Ridgel evaluation.   3-Hypoglycemia; unclear etiology. Not on oral hypoglycemic agents. Patient was hypothermic. cortisol level normal at 12.. IV fluids D 5 NS. cbg every 2 hours. Start regular diet. Change CBG check to every 6 hours. Will discontinue fluids when patient tolerating diet.   4-Hypothermia; bear hugger. Blood culture to check for infection pending.  5-Dementia; resume medications.  6-influenza panel ordered; per ED physician patient was on tamiflu prophylaxis. Multiple cases of influeza at her facility. Influenza panel pending.  7-Chronic Thrombocytopenia; stable.   Code Status: presume full code.  Family Communication: discussed with patient.  Disposition Plan: transfer to telemetry.    Consultants:  Cardiology.   Procedures:  ECHO; pending.   Antibiotics:  none  HPI/Subjective: Denies headaches. She is pleasantly confuse. Denies cough, diarrhea. Willing to eat.   Objective: Filed Vitals:   02/28/13 0400  BP:   Pulse: 54  Temp: 97.2 F (36.2 C)  Resp: 14    Intake/Output Summary (Last 24 hours) at 02/28/13 0815 Last data filed at 02/28/13 0600  Gross per 24 hour  Intake 1223.33 ml  Output    600 ml  Net 623.33 ml   Filed Weights   02/27/13 1745 02/28/13 0400  Weight: 78.9 kg (173 lb 15.1 oz) 79.6 kg (175 lb 7.8 oz)    Exam:   General:  No distress.   Cardiovascular: S 1, S 2 RRR  Respiratory: CTA  Abdomen: BS present, soft,  NT  Musculoskeletal: trace edema.  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 Confused.   Data Reviewed: Basic Metabolic Panel:  Recent Labs Lab 02/27/13 1300 02/27/13 1639 02/28/13 0325  NA 139  --  139  K 4.3  --  3.6*  CL 102  --  105  CO2 27  --  23  GLUCOSE 74  --  115*  BUN 32*  --  19  CREATININE 0.48*  --  0.32*  CALCIUM 9.9  --  8.5  MG  --  1.8  --    Liver Function Tests:  Recent Labs Lab 02/27/13 1300 02/28/13 0325  AST 12 13  ALT 8 8  ALKPHOS 74 59  BILITOT 0.4 0.3  PROT 6.6 5.3*  ALBUMIN 3.2* 2.5*   No results found for this basename: LIPASE, AMYLASE,  in the last 168 hours  Recent Labs Lab 02/27/13 1300  AMMONIA 15   CBC:  Recent Labs Lab 02/27/13 1300 02/28/13 0325  WBC 7.1 6.4  NEUTROABS 4.6  --   HGB 14.1 12.9  HCT 41.5 38.7  MCV 94.3 94.2  PLT 118* 91*   Cardiac Enzymes:  Recent Labs Lab 02/27/13 1639 02/27/13 2153 02/28/13 0324  TROPONINI <0.30 <0.30 <0.30   BNP (last 3 results) No results found for this basename: PROBNP,  in the last 8760 hours CBG:  Recent Labs Lab 02/27/13 1312 02/27/13 1639 02/27/13 1915 02/27/13 2158 02/27/13 2312  GLUCAP 66* 97 97 88 94    Recent Results (from the past 240 hour(s))  MRSA PCR SCREENING  Status: Abnormal   Collection Time    02/27/13  5:39 PM      Result Value Ref Range Status   MRSA by PCR POSITIVE (*) NEGATIVE Final   Comment:            The GeneXpert MRSA Assay (FDA     approved for NASAL specimens     only), is one component of a     comprehensive MRSA colonization     surveillance program. It is not     intended to diagnose MRSA     infection nor to guide or     monitor treatment for     MRSA infections.     RESULT CALLED TO, READ BACK BY AND VERIFIED WITH:     JOHNSON,R RN @1902  ON 02.18.2015 BY MCREYNOLDS,B     Studies: Ct Head Wo Contrast  02/27/2013   CLINICAL DATA:  Unresponsive. Altered mental status. History of  dementia.  EXAM: CT HEAD WITHOUT CONTRAST  TECHNIQUE: Contiguous axial images were obtained from the base of the skull through the vertex without intravenous contrast.  COMPARISON:  05/20/2012  FINDINGS: There is no evidence for acute hemorrhage, hydrocephalus, mass lesion, or abnormal extra-axial fluid collection. No definite CT evidence for acute infarction. Diffuse loss of parenchymal volume is consistent with atrophy. Patchy low attenuation in the deep hemispheric and periventricular white matter is nonspecific, but likely reflects chronic microvascular ischemic demyelination.  Hypoplastic left sphenoid sinus shows chronic opacification. There is been interval progression of circumferential mucosal disease in the right sphenoid sinus. The remaining visualized paranasal sinuses and mastoid air cells are clear.  IMPRESSION: Stable CT evaluation of the brain. There is atrophy with chronic small vessel white matter ischemic demyelination. No acute findings.  Interval progression of sphenoid sinusitis.   Electronically Signed   By: Kennith CenterEric  Mansell M.D.   On: 02/27/2013 14:09   Dg Chest Portable 1 View  02/27/2013   CLINICAL DATA:  Altered mental status.  EXAM: PORTABLE CHEST - 1 VIEW  COMPARISON:  06/18/2012  FINDINGS: Mild cardiomegaly with normal mediastinal contours. No acute infiltrate or edema. No effusion or pneumothorax. No acute osseous findings. 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  IMPRESSION: No active disease.   Electronically Signed   By: Tiburcio PeaJonathan  Watts M.D.   On: 02/27/2013 13:56    Scheduled Meds: . aspirin  81 mg Oral Daily  . divalproex  250 mg Oral TID  . donepezil  5 mg Oral QPM  . gabapentin  100 mg Oral Daily  . heparin  5,000 Units Subcutaneous 3 times per day  . sertraline  50 mg Oral Daily   Continuous Infusions: . dextrose 5 % and 0.9% NaCl 100 mL/hr at 02/28/13 0346    Active  Problems:   Altered mental status   Encephalopathy   Arrhythmia    Time spent: 35 minutes.     Kelsie Zaborowski  [facility] Pager [phone]. If 7PM-7AM, please contact night-coverage at [email or web address], password [email or web address] 02/28/2013, 8:15 AM  LOS: 1 day

## 2013-03-01 ENCOUNTER — Inpatient Hospital Stay (HOSPITAL_COMMUNITY): Payer: Medicare Other

## 2013-03-01 LAB — CBC
HCT: 37.2 % (ref 36.0–46.0)
HEMOGLOBIN: 12.6 g/dL (ref 12.0–15.0)
MCH: 31.7 pg (ref 26.0–34.0)
MCHC: 33.9 g/dL (ref 30.0–36.0)
MCV: 93.7 fL (ref 78.0–100.0)
Platelets: 100 10*3/uL — ABNORMAL LOW (ref 150–400)
RBC: 3.97 MIL/uL (ref 3.87–5.11)
RDW: 13.8 % (ref 11.5–15.5)
WBC: 6.5 10*3/uL (ref 4.0–10.5)

## 2013-03-01 LAB — GLUCOSE, CAPILLARY
Glucose-Capillary: 103 mg/dL — ABNORMAL HIGH (ref 70–99)
Glucose-Capillary: 116 mg/dL — ABNORMAL HIGH (ref 70–99)
Glucose-Capillary: 92 mg/dL (ref 70–99)

## 2013-03-01 LAB — BASIC METABOLIC PANEL
BUN: 20 mg/dL (ref 6–23)
CO2: 23 mEq/L (ref 19–32)
Calcium: 8.6 mg/dL (ref 8.4–10.5)
Chloride: 108 mEq/L (ref 96–112)
Creatinine, Ser: 0.35 mg/dL — ABNORMAL LOW (ref 0.50–1.10)
GFR calc Af Amer: >90 mL/min (ref >90)
GLUCOSE: 96 mg/dL (ref 70–99)
POTASSIUM: 3.9 meq/L (ref 3.7–5.3)
SODIUM: 140 meq/L (ref 137–147)

## 2013-03-01 NOTE — Progress Notes (Signed)
[facility] PROGRESS NOTE  Carolyn Ford ZOX:[id number] DOB: Dec 19, 1940 DOA: 02/27/2013 PCP: Florentina Jenny, MD  Assessment/Plan: 1-Encephalopathy, AMS;hypoglycemia ? Hypothermia ? At least initially. Patient with history of dementia. This appears to got worse over last 3 weeks per staff at ALF. Ct head negative for acute bleed. Check TSH normal , B 12 ; 1384 , RPR negative. Chest x ray and UA negative for infections. MRI negative for stroke.   -Patient sleepy, just received ativan for MRI.  -if agitation continue with consult psych.   2 Questionable Arrhthymias; no evidence of arrythmia. Episode was likely artifact from tremors. Troponin negative, check ECHO pending.  Mg level normal. Appreciate Dr Ladona Ridgel evaluation.   3-Hypoglycemia; unclear etiology. Not on oral hypoglycemic agents. Patient was hypothermic. cortisol level normal at 12.. IV fluids D 5 NS. cbg every 2 hours. Start regular diet. Change CBG check to every 6 hours. Will discontinue fluids when patient tolerating diet.   4-Hypothermia; bear hugger. Blood culture no growth.   5-Dementia; resume medications.   6-influenza panel ordered; per ED physician patient was on tamiflu prophylaxis. Multiple cases of influeza at her facility. Influenza panel negative.   7-Chronic Thrombocytopenia; stable.   Code Status: presume full code.  Family Communication: discussed with patient.  Disposition Plan: transfer to telemetry.    Consultants:  Cardiology.   Procedures:  ECHO; pending.   Antibiotics:  none  HPI/Subjective: Say few words. Sleepy,  Objective: Filed Vitals:   03/01/13 1400  BP: 107/67  Pulse: 76  Temp: 97.6 F (36.4 C)  Resp: 18    Intake/Output Summary (Last 24 hours) at 03/01/13 1817 Last data filed at 03/01/13 1300  Gross per 24 hour  Intake   1540 ml  Output      0 ml  Net   1540 ml   Filed Weights   02/27/13 1745 02/28/13 0400  Weight: 78.9 kg (173 lb 15.1 oz) 79.6 kg (175 lb 7.8 oz)     Exam:   General:  No distress.   Cardiovascular: S 1, S 2 RRR  Respiratory: CTA  Abdomen: BS present, soft, NT  Musculoskeletal: trace edema.  Neuro; sedated. .   Data Reviewed: Basic Metabolic Panel:  Recent Labs Lab 02/27/13 1300 02/27/13 1639 02/28/13 0325 03/01/13 0503  NA 139  --  139 140  K 4.3  --  3.6* 3.9  CL 102  --  105 108  CO2 27  --  23 23  GLUCOSE 74  --  115* 96  BUN 32*  --  19 20  CREATININE 0.48*  --  0.32* 0.35*  CALCIUM 9.9  --  8.5 8.6  MG  --  1.8  --   --    Liver Function Tests:  Recent Labs Lab 02/27/13 1300 02/28/13 0325  AST 12 13  ALT 8 8  ALKPHOS 74 59  BILITOT 0.4 0.3  PROT 6.6 5.3*  ALBUMIN 3.2* 2.5*   No results found for this basename: LIPASE, AMYLASE,  in the last 168 hours  Recent Labs Lab 02/27/13 1300  AMMONIA 15   CBC:  Recent Labs Lab 02/27/13 1300 02/28/13 0325 03/01/13 0503  WBC 7.1 6.4 6.5  NEUTROABS 4.6  --   --   HGB 14.1 12.9 12.6  HCT 41.5 38.7 37.2  MCV 94.3 94.2 93.7  PLT 118* 91* 100*   Cardiac Enzymes:  Recent Labs Lab 02/27/13 1639 02/27/13 2153 02/28/13 0324  TROPONINI <0.30 <0.30 <0.30   BNP (last 3 results)  No results found for this basename: PROBNP,  in the last 8760 hours CBG:  Recent Labs Lab 02/28/13 0830 02/28/13 1228 03/01/13 0038 03/01/13 0636 03/01/13 1240  GLUCAP 95 92 116* 103* 92    Recent Results (from the past 240 hour(s))  CULTURE, BLOOD (ROUTINE X 2)     Status: None   Collection Time    02/27/13  4:30 PM      Result Value Ref Range Status   Specimen Description BLOOD RIGHT ARM   Final   Special Requests BOTTLES DRAWN AEROBIC AND ANAEROBIC 4CC EACH   Final   Culture  Setup Time     Final   Value: 02/27/2013 20:01     Performed at [facility]   Culture     Final   Value:        BLOOD CULTURE RECEIVED NO GROWTH TO DATE CULTURE WILL BE HELD FOR 5 DAYS BEFORE ISSUING A FINAL NEGATIVE REPORT     Performed at [facility]    Report Status PENDING   Incomplete  CULTURE, BLOOD (ROUTINE X 2)     Status: None   Collection Time    02/27/13  4:30 PM      Result Value Ref Range Status   Specimen Description BLOOD RIGHT ARM   Final   Special Requests BOTTLES DRAWN AEROBIC AND ANAEROBIC 4CC EACH   Final   Culture  Setup Time     Final   Value: 02/27/2013 20:01     Performed at [facility]   Culture     Final   Value:        BLOOD CULTURE RECEIVED NO GROWTH TO DATE CULTURE WILL BE HELD FOR 5 DAYS BEFORE ISSUING A FINAL NEGATIVE REPORT     Performed at [facility]   Report Status PENDING   Incomplete  MRSA PCR SCREENING     Status: Abnormal   Collection Time    02/27/13  5:39 PM      Result Value Ref Range Status   MRSA by PCR POSITIVE (*) NEGATIVE Final   Comment:            The GeneXpert MRSA Assay (FDA     approved for NASAL specimens     only), is one component of a     comprehensive MRSA colonization     surveillance program. It is not     intended to diagnose MRSA     infection nor to guide or     monitor treatment for     MRSA infections.     RESULT CALLED TO, READ BACK BY AND VERIFIED WITH:     JOHNSON,R RN @1902  ON 02.18.2015 BY MCREYNOLDS,B     Studies: Mr Brain Wo Contrast  03/01/2013   CLINICAL DATA:  Severe confusion.  Evaluate for possible stroke.  EXAM: MRI HEAD WITHOUT CONTRAST  TECHNIQUE: Multiplanar, multiecho pulse sequences of the brain and surrounding structures were obtained without intravenous contrast.  COMPARISON:  Head CT 02/27/2013  FINDINGS: Images are mildly degraded by motion artifact despite premedication and repeating sequences.  There is no acute infarct. There is no evidence of intracranial hemorrhage. There is mild to moderate generalized cerebral atrophy. Patchy T2 hyperintensities within the periventricular white matter are nonspecific but compatible with mild chronic small vessel ischemic disease. There is no evidence of mass, midline shift, or extra-axial  fluid collection.  Major intracranial vascular flow voids are preserved. Orbits are unremarkable. Prominent sphenoid sinus  mucosal thickening is present. Mastoid air cells are clear.  IMPRESSION: 1. No evidence of acute intracranial abnormality. 2. Cerebral atrophy and mild chronic small vessel ischemic disease.   Electronically Signed   By: Sebastian AcheAllen  Grady   On: 03/01/2013 12:01    Scheduled Meds: . aspirin  81 mg Oral Daily  . Chlorhexidine Gluconate Cloth  6 each Topical Q0600  . divalproex  250 mg Oral TID  . donepezil  5 mg Oral QPM  . gabapentin  100 mg Oral Daily  . heparin  5,000 Units Subcutaneous 3 times per day  . mupirocin ointment  1 application Nasal BID  . sertraline  50 mg Oral Daily   Continuous Infusions: . dextrose 5 % and 0.9% NaCl 100 mL/hr at 03/01/13 [id number]    Active Problems:   Altered mental status   Encephalopathy   Arrhythmia    Time spent: 35 minutes.     Carolyn Ford  [facility] Pager [phone]. If 7PM-7AM, please contact night-coverage at [email or web address], password [email or web address] 03/01/2013, 6:17 PM  LOS: 2 days

## 2013-03-01 NOTE — Progress Notes (Signed)
[facility], [facility] were [facility] and she will be able to move there upon discharge.  Trenda Corliss C. Margo Lama MSW, LCSW [phone]

## 2013-03-01 NOTE — Progress Notes (Signed)
PT Cancellation Note / Screen  Patient Details Name: Carolyn BrookeSuzanne Ford MRN: [id number] DOB: 09/23/1940   Cancelled Treatment:    Reason Eval/Treat Not Completed: PT screened, no needs identified, will sign off  Per chart review, pt from ALF, chronic paraplegia, and admitted with AMS.  Attempted to call ALF however no answer so contacted POA [name] who states pt lifted to w/c for mobility.  Pt does not appear appropriate for acute PT at this time.  Will defer any needs to facility.   Luceil Herrin,KATHrine E 03/01/2013, 1:55 PM Zenovia JarredKati Dorman Calderwood, PT, DPT 03/01/2013 Pager: [phone]

## 2013-03-01 NOTE — Progress Notes (Signed)
OT Cancellation Note  Patient Details Name: Carolyn BrookeSuzanne Ford MRN: [id number] DOB: 08/26/1940   Cancelled Treatment:    Reason Eval/Treat Not Completed: OT screened, no needs identified, will sign off.  Noted pt from [facility] and plans return there.  Pt will chronic paraplegia and on memory care unit.    Virgilio Broadhead 03/01/2013, 3:51 PM Marica OtterMaryellen Nioma Mccubbins, OTR/L [phone] 03/01/2013

## 2013-03-01 NOTE — Progress Notes (Signed)
OT Cancellation Note  Patient Details Name: Charlene BrookeSuzanne Ginley MRN: [id number] DOB: 04/10/1940   Cancelled Treatment:    Reason Eval/Treat Not Completed: Patient at procedure or test/ unavailable At MRI earlier and results were pending when OT checked by later. Will try back later as schedule allows.  Lennox LaityStone, Winfred Redel Stafford [phone] 03/01/2013, 12:53 PM

## 2013-03-02 LAB — GLUCOSE, CAPILLARY
Glucose-Capillary: 115 mg/dL — ABNORMAL HIGH (ref 70–99)
Glucose-Capillary: 103 mg/dL — ABNORMAL HIGH (ref 70–99)
Glucose-Capillary: 76 mg/dL (ref 70–99)
Glucose-Capillary: 85 mg/dL (ref 70–99)

## 2013-03-02 MED ORDER — LORAZEPAM 0.5 MG PO TABS
0.2500 mg | ORAL_TABLET | Freq: Two times a day (BID) | ORAL | Status: DC | PRN
  Administered 2013-03-02 – 2013-03-03 (×3): 0.25 mg via ORAL
  Filled 2013-03-02 (×3): qty 1

## 2013-03-02 NOTE — Progress Notes (Signed)
OT Cancellation Note  Patient Details Name: Carolyn BrookeSuzanne Ford MRN: [id number] DOB: 06/08/1940   Cancelled Treatment:    Reason Eval/Treat Not Completed: OT screened 2/20, no OT needs, OT will sign off.  Karys Meckley A 03/02/2013, 12:30 PM

## 2013-03-02 NOTE — Progress Notes (Signed)
[facility] PROGRESS NOTE  Carolyn Ford ZOX:[id number] DOB: 1940/05/14 DOA: 02/27/2013 PCP: Florentina Jenny, MD  Assessment/Plan: 1-Encephalopathy, AMS;hypoglycemia ? Hypothermia ?[name] Patient with history of dementia. This appears to got worse over last 3 weeks per staff at ALF. Ct head negative for acute bleed.  TSH normal , B 12 ; 1384 , RPR negative. Chest x ray and UA negative for infections. MRI negative for stroke.   -Patient more calm. She wake up to answer questions. She is willing to eat.  -She is oriented to place and person.  -Component of delirium, and or worsening dementia.   2-Hypoglycemia; unclear etiology. Not on oral hypoglycemic agents. Patient was hypothermic. cortisol level normal at 12.. IV fluids D 5 NS. cbg every 2 hours. Start regular diet. Change CBG check to every 6 hours. Will discontinue fluids when patient tolerating diet.   3-Hypothermia; Resolved.  Was initially treated with bear hugger. Blood culture no growth.   4-Dementia; resume medications.   5-influenza panel ordered; per ED physician patient was on tamiflu prophylaxis. Multiple cases of influeza at her facility. Influenza panel negative.   6-Chronic Thrombocytopenia; stable.   Resolved.   Questionable Arrhthymias; no evidence of arrythmia. Episode was likely artifact from tremors. Troponin negative, check ECHO pending.  Mg level normal. Appreciate Dr Ladona Ridgel evaluation.  Code Status: presume full code.  Family Communication: discussed with patient.  Disposition Plan: Back to facility to the memory unit.    Consultants:  Cardiology.   Procedures: ECHO; Left ventricle: Technically difficult study. The cavity size was normal. Wall thickness was increased in a pattern of mild LVH. The estimated ejection fraction was 60%. Regional wall motion abnormalities cannot be excluded. - Aortic valve: Sclerosis without stenosis. No  significant regurgitation.    Antibiotics:  none  HPI/Subjective: Following command, calm. No complaints.   Objective: Filed Vitals:   03/02/13 0700  BP: 113/76  Pulse: 75  Temp: 98.2 F (36.8 C)  Resp: 17    Intake/Output Summary (Last 24 hours) at 03/02/13 1150 Last data filed at 03/01/13 2300  Gross per 24 hour  Intake   1840 ml  Output      0 ml  Net   1840 ml   Filed Weights   02/27/13 1745 02/28/13 0400 03/02/13 0700  Weight: 78.9 kg (173 lb 15.1 oz) 79.6 kg (175 lb 7.8 oz) 79.153 kg (174 lb 8 oz)    Exam:   General:  No distress.   Cardiovascular: S 1, S 2 RRR  Respiratory: CTA  Abdomen: BS present, soft, NT  Musculoskeletal: trace edema.  Neuro; sedated. .   Data Reviewed: Basic Metabolic Panel:  Recent Labs Lab 02/27/13 1300 02/27/13 1639 02/28/13 0325 03/01/13 0503  NA 139  --  139 140  K 4.3  --  3.6* 3.9  CL 102  --  105 108  CO2 27  --  23 23  GLUCOSE 74  --  115* 96  BUN 32*  --  19 20  CREATININE 0.48*  --  0.32* 0.35*  CALCIUM 9.9  --  8.5 8.6  MG  --  1.8  --   --    Liver Function Tests:  Recent Labs Lab 02/27/13 1300 02/28/13 0325  AST 12 13  ALT 8 8  ALKPHOS 74 59  BILITOT 0.4 0.3  PROT 6.6 5.3*  ALBUMIN 3.2* 2.5*   No results found for this basename: LIPASE, AMYLASE,  in the last 168 hours  Recent Labs Lab 02/27/13 1300  AMMONIA 15   CBC:  Recent Labs Lab 02/27/13 1300 02/28/13 0325 03/01/13 0503  WBC 7.1 6.4 6.5  NEUTROABS 4.6  --   --   HGB 14.1 12.9 12.6  HCT 41.5 38.7 37.2  MCV 94.3 94.2 93.7  PLT 118* 91* 100*   Cardiac Enzymes:  Recent Labs Lab 02/27/13 1639 02/27/13 2153 02/28/13 0324  TROPONINI <0.30 <0.30 <0.30   BNP (last 3 results) No results found for this basename: PROBNP,  in the last 8760 hours CBG:  Recent Labs Lab 03/01/13 0636 03/01/13 1240 03/01/13 1718 03/02/13 0117 03/02/13 0756  GLUCAP 103* 92 115* 103* 76    Recent Results (from the past 240 hour(s))   CULTURE, BLOOD (ROUTINE X 2)     Status: None   Collection Time    02/27/13  4:30 PM      Result Value Ref Range Status   Specimen Description BLOOD RIGHT ARM   Final   Special Requests BOTTLES DRAWN AEROBIC AND ANAEROBIC 4CC EACH   Final   Culture  Setup Time     Final   Value: 02/27/2013 20:01     Performed at [facility]   Culture     Final   Value:        BLOOD CULTURE RECEIVED NO GROWTH TO DATE CULTURE WILL BE HELD FOR 5 DAYS BEFORE ISSUING A FINAL NEGATIVE REPORT     Performed at [facility]   Report Status PENDING   Incomplete  CULTURE, BLOOD (ROUTINE X 2)     Status: None   Collection Time    02/27/13  4:30 PM      Result Value Ref Range Status   Specimen Description BLOOD RIGHT ARM   Final   Special Requests BOTTLES DRAWN AEROBIC AND ANAEROBIC 4CC EACH   Final   Culture  Setup Time     Final   Value: 02/27/2013 20:01     Performed at [facility]   Culture     Final   Value:        BLOOD CULTURE RECEIVED NO GROWTH TO DATE CULTURE WILL BE HELD FOR 5 DAYS BEFORE ISSUING A FINAL NEGATIVE REPORT     Performed at [facility]   Report Status PENDING   Incomplete  MRSA PCR SCREENING     Status: Abnormal   Collection Time    02/27/13  5:39 PM      Result Value Ref Range Status   MRSA by PCR POSITIVE (*) NEGATIVE Final   Comment:            The GeneXpert MRSA Assay (FDA     approved for NASAL specimens     only), is one component of a     comprehensive MRSA colonization     surveillance program. It is not     intended to diagnose MRSA     infection nor to guide or     monitor treatment for     MRSA infections.     RESULT CALLED TO, READ BACK BY AND VERIFIED WITH:     JOHNSON,R RN @1902  ON 02.18.2015 BY MCREYNOLDS,B     Studies: Mr Brain Wo Contrast  03/01/2013   CLINICAL DATA:  Severe confusion.  Evaluate for possible stroke.  EXAM: MRI HEAD WITHOUT CONTRAST  TECHNIQUE: Multiplanar, multiecho pulse sequences of the brain and  surrounding structures were obtained without intravenous contrast.  COMPARISON:  Head CT 02/27/2013  FINDINGS: Images are mildly degraded  by motion artifact despite premedication and repeating sequences.  There is no acute infarct. There is no evidence of intracranial hemorrhage. There is mild to moderate generalized cerebral atrophy. Patchy T2 hyperintensities within the periventricular white matter are nonspecific but compatible with mild chronic small vessel ischemic disease. There is no evidence of mass, midline shift, or extra-axial fluid collection.  Major intracranial vascular flow voids are preserved. Orbits are unremarkable. Prominent sphenoid sinus mucosal thickening is present. Mastoid air cells are clear.  IMPRESSION: 1. No evidence of acute intracranial abnormality. 2. Cerebral atrophy and mild chronic small vessel ischemic disease.   Electronically Signed   By: Sebastian AcheAllen  Grady   On: 03/01/2013 12:01    Scheduled Meds: . aspirin  81 mg Oral Daily  . Chlorhexidine Gluconate Cloth  6 each Topical Q0600  . divalproex  250 mg Oral TID  . donepezil  5 mg Oral QPM  . gabapentin  100 mg Oral Daily  . heparin  5,000 Units Subcutaneous 3 times per day  . mupirocin ointment  1 application Nasal BID  . sertraline  50 mg Oral Daily   Continuous Infusions: . dextrose 5 % and 0.9% NaCl 100 mL/hr at 03/02/13 0327    Active Problems:   Altered mental status   Encephalopathy   Arrhythmia    Time spent: 35 minutes.     Jackson Fetters  [facility] Pager [phone]. If 7PM-7AM, please contact night-coverage at [email or web address], password [email or web address] 03/02/2013, 11:50 AM  LOS: 3 days

## 2013-03-03 DIAGNOSIS — D649 Anemia, unspecified: Secondary | ICD-10-CM

## 2013-03-03 LAB — GLUCOSE, CAPILLARY
GLUCOSE-CAPILLARY: 95 mg/dL (ref 70–99)
Glucose-Capillary: 90 mg/dL (ref 70–99)
Glucose-Capillary: 114 mg/dL — ABNORMAL HIGH (ref 70–99)
Glucose-Capillary: 95 mg/dL (ref 70–99)

## 2013-03-03 LAB — CBC
HCT: 36 % (ref 36.0–46.0)
Hemoglobin: 12.1 g/dL (ref 12.0–15.0)
MCH: 31.5 pg (ref 26.0–34.0)
MCHC: 33.6 g/dL (ref 30.0–36.0)
MCV: 93.8 fL (ref 78.0–100.0)
PLATELETS: 98 10*3/uL — AB (ref 150–400)
RBC: 3.84 MIL/uL — ABNORMAL LOW (ref 3.87–5.11)
RDW: 14.1 % (ref 11.5–15.5)
WBC: 6 10*3/uL (ref 4.0–10.5)

## 2013-03-03 LAB — BASIC METABOLIC PANEL
BUN: 19 mg/dL (ref 6–23)
CO2: 23 mEq/L (ref 19–32)
CREATININE: 0.41 mg/dL — AB (ref 0.50–1.10)
Calcium: 8.4 mg/dL (ref 8.4–10.5)
Chloride: 108 mEq/L (ref 96–112)
GFR calc non Af Amer: >90 mL/min (ref >90)
Glucose, Bld: 104 mg/dL — ABNORMAL HIGH (ref 70–99)
Potassium: 3.5 mEq/L — ABNORMAL LOW (ref 3.7–5.3)
Sodium: 140 mEq/L (ref 137–147)

## 2013-03-03 MED ORDER — POTASSIUM CHLORIDE CRYS ER 20 MEQ PO TBCR
40.0000 meq | EXTENDED_RELEASE_TABLET | Freq: Once | ORAL | Status: AC
  Administered 2013-03-03: 40 meq via ORAL
  Filled 2013-03-03: qty 2

## 2013-03-03 NOTE — Progress Notes (Signed)
[facility] PROGRESS NOTE  Carolyn Ford ZOX:[id number] DOB: [birth year] DOA: 02/27/2013 PCP: Florentina Jenny, MD  Assessment/Plan: 1-Encephalopathy, AMS;hypoglycemia ? Hypothermia ?Carolyn Ford Patient with history of dementia. This appears to got worse over last 3 weeks per staff at ALF. Ct head negative for acute bleed.  TSH normal , B 12 ; 1384 , RPR negative. Chest x ray and UA negative for infections. MRI negative for stroke.   -Patient more calm. She wake up to answer questions. She is willing to eat.  -She is oriented to place and person. Still confuse.  -Component of delirium, and or worsening dementia.   2-Hypoglycemia; unclear etiology. Not on oral hypoglycemic agents. Patient was hypothermic. cortisol level normal at 12..  -DC  fluids D 5 NS. regular diet. Change CBG check to every 6 hours.   3-Hypothermia; Resolved.  Was initially treated with bear hugger. Blood culture no growth.   4-Dementia; resume medications.   5-influenza panel ordered; per ED physician patient was on tamiflu prophylaxis. Multiple cases of influeza at her facility. Influenza panel negative.   6-Chronic Thrombocytopenia; stable.   Resolved.   Questionable Arrhthymias; no evidence of arrythmia. Episode was likely artifact from tremors. Troponin negative, check ECHO pending.  Mg level normal. Appreciate Dr Ladona Ridgel evaluation.   Code Status: presume full code.  Family Communication: discussed with patient.  Disposition Plan: Back to facility to the memory unit Monday.    Consultants:  Cardiology.   Procedures: ECHO; Left ventricle: Technically difficult study. The cavity size was normal. Wall thickness was increased in a pattern of mild LVH. The estimated ejection fraction was 60%. Regional wall motion abnormalities cannot be excluded. - Aortic valve: Sclerosis without stenosis. No significant regurgitation.    Antibiotics:  none  HPI/Subjective: Alert, speech clear, not making sense.    Objective: Filed Vitals:   03/03/13 1300  BP: 100/62  Pulse: 86  Temp: 97.7 F (36.5 C)  Resp: 20    Intake/Output Summary (Last 24 hours) at 03/03/13 1339 Last data filed at 03/03/13 1257  Gross per 24 hour  Intake   2160 ml  Output      0 ml  Net   2160 ml   Filed Weights   02/27/13 1745 02/28/13 0400 03/02/13 0700  Weight: 78.9 kg (173 lb 15.1 oz) 79.6 kg (175 lb 7.8 oz) 79.153 kg (174 lb 8 oz)    Exam:   General:  No distress.   Cardiovascular: S 1, S 2 RRR  Respiratory: CTA  Abdomen: BS present, soft, NT  Musculoskeletal: trace edema.  Neuro;  Alert confused  Data Reviewed: Basic Metabolic Panel:  Recent Labs Lab 02/27/13 1300 02/27/13 1639 02/28/13 0325 03/01/13 0503 03/03/13 0545  NA 139  --  139 140 140  K 4.3  --  3.6* 3.9 3.5*  CL 102  --  105 108 108  CO2 27  --  23 23 23   GLUCOSE 74  --  115* 96 104*  BUN 32*  --  19 20 19   CREATININE 0.48*  --  0.32* 0.35* 0.41*  CALCIUM 9.9  --  8.5 8.6 8.4  MG  --  1.8  --   --   --    Liver Function Tests:  Recent Labs Lab 02/27/13 1300 02/28/13 0325  AST 12 13  ALT 8 8  ALKPHOS 74 59  BILITOT 0.4 0.3  PROT 6.6 5.3*  ALBUMIN 3.2* 2.5*   No results found for this basename: LIPASE, AMYLASE,  in the last  168 hours  Recent Labs Lab 02/27/13 1300  AMMONIA 15   CBC:  Recent Labs Lab 02/27/13 1300 02/28/13 0325 03/01/13 0503 03/03/13 0545  WBC 7.1 6.4 6.5 6.0  NEUTROABS 4.6  --   --   --   HGB 14.1 12.9 12.6 12.1  HCT 41.5 38.7 37.2 36.0  MCV 94.3 94.2 93.7 93.8  PLT 118* 91* 100* 98*   Cardiac Enzymes:  Recent Labs Lab 02/27/13 1639 02/27/13 2153 02/28/13 0324  TROPONINI <0.30 <0.30 <0.30   BNP (last 3 results) No results found for this basename: PROBNP,  in the last 8760 hours CBG:  Recent Labs Lab 03/02/13 0756 03/02/13 1149 03/02/13 2345 03/03/13 0651 03/03/13 1128  GLUCAP 76 85 95 90 114*    Recent Results (from the past 240 hour(s))  CULTURE, BLOOD  (ROUTINE X 2)     Status: None   Collection Time    02/27/13  4:30 PM      Result Value Ref Range Status   Specimen Description BLOOD RIGHT ARM   Final   Special Requests BOTTLES DRAWN AEROBIC AND ANAEROBIC 4CC EACH   Final   Culture  Setup Time     Final   Value: 02/27/2013 20:01     Performed at [facility]   Culture     Final   Value:        BLOOD CULTURE RECEIVED NO GROWTH TO DATE CULTURE WILL BE HELD FOR 5 DAYS BEFORE ISSUING A FINAL NEGATIVE REPORT     Performed at [facility]   Report Status PENDING   Incomplete  CULTURE, BLOOD (ROUTINE X 2)     Status: None   Collection Time    02/27/13  4:30 PM      Result Value Ref Range Status   Specimen Description BLOOD RIGHT ARM   Final   Special Requests BOTTLES DRAWN AEROBIC AND ANAEROBIC 4CC EACH   Final   Culture  Setup Time     Final   Value: 02/27/2013 20:01     Performed at [facility]   Culture     Final   Value:        BLOOD CULTURE RECEIVED NO GROWTH TO DATE CULTURE WILL BE HELD FOR 5 DAYS BEFORE ISSUING A FINAL NEGATIVE REPORT     Performed at [facility]   Report Status PENDING   Incomplete  MRSA PCR SCREENING     Status: Abnormal   Collection Time    02/27/13  5:39 PM      Result Value Ref Range Status   MRSA by PCR POSITIVE (*) NEGATIVE Final   Comment:            The GeneXpert MRSA Assay (FDA     approved for NASAL specimens     only), is one component of a     comprehensive MRSA colonization     surveillance program. It is not     intended to diagnose MRSA     infection nor to guide or     monitor treatment for     MRSA infections.     RESULT CALLED TO, READ BACK BY AND VERIFIED WITH:     JOHNSON,R RN @1902  ON 02.18.2015 BY MCREYNOLDS,B     Studies: No results found.  Scheduled Meds: . aspirin  81 mg Oral Daily  . Chlorhexidine Gluconate Cloth  6 each Topical Q0600  . divalproex  250 mg Oral TID  . donepezil  5 mg Oral QPM  . gabapentin  100 mg Oral Daily  .  heparin  5,000 Units Subcutaneous 3 times per day  . mupirocin ointment  1 application Nasal BID  . potassium chloride  40 mEq Oral Once  . sertraline  50 mg Oral Daily   Continuous Infusions:    Active Problems:   Altered mental status   Encephalopathy   Arrhythmia    Time spent: 35 minutes.     Kateline Kinkade  [facility] Pager [phone]. If 7PM-7AM, please contact night-coverage at [email or web address], password [email or web address] 03/03/2013, 1:39 PM  LOS: 4 days

## 2013-03-03 NOTE — Progress Notes (Signed)
PT Cancellation Note  Patient Details Name: Carolyn BrookeSuzanne Ford MRN: [id number] DOB: 01/09/1941   Cancelled Treatment:     Pt screened.  Pt with chronic paraplegia and residing in Memory Care Unit.  POA Leonette Most(Charles) contacted by Gaetana MichaelisK Lemyre PT and reported pt utilized lift equipment for mobility.  At this time, pt is not appropriate for acute care PT services.   Verlon Pischke 03/03/2013, 2:50 PM

## 2013-03-04 LAB — GLUCOSE, CAPILLARY
GLUCOSE-CAPILLARY: 87 mg/dL (ref 70–99)
Glucose-Capillary: 72 mg/dL (ref 70–99)
Glucose-Capillary: 94 mg/dL (ref 70–99)

## 2013-03-04 LAB — BASIC METABOLIC PANEL
BUN: 20 mg/dL (ref 6–23)
CHLORIDE: 109 meq/L (ref 96–112)
CO2: 23 meq/L (ref 19–32)
Calcium: 8.6 mg/dL (ref 8.4–10.5)
Creatinine, Ser: 0.42 mg/dL — ABNORMAL LOW (ref 0.50–1.10)
GFR calc Af Amer: >90 mL/min (ref >90)
GFR calc non Af Amer: >90 mL/min (ref >90)
GLUCOSE: 84 mg/dL (ref 70–99)
Potassium: 3.7 mEq/L (ref 3.7–5.3)
Sodium: 142 mEq/L (ref 137–147)

## 2013-03-04 MED ORDER — LORAZEPAM 0.5 MG PO TABS: 0.2500 mg | ORAL_TABLET | Freq: Two times a day (BID) | ORAL | Status: AC | PRN

## 2013-03-04 NOTE — Progress Notes (Signed)
[facility]

## 2013-03-04 NOTE — Care Management Note (Addendum)
    Page 1 of 2   03/04/2013     2:00:08 PM   CARE MANAGEMENT NOTE 03/04/2013  Patient:  Charlene BrookeORTER,Perry   Account Number:  [id number]  Date Initiated:  02/28/2013  Documentation initiated by:  DAVIS,RHONDA  Subjective/Objective Assessment:   pt.confused,combative,hypothermic,placed on bear hugger/pt continues to require restraints -posey belt and mittens-has chewed through iv line and attempted to bite staff several times     Action/Plan:   from snf-/[facility] [place]   Anticipated DC Date:  03/04/2013   Anticipated DC Plan:  ASSISTED LIVING / REST HOME  In-house referral  Clinical Social Worker      DC Associate Professorlanning Services  CM consult      [facility]  Resumption Of Svcs/PTA Provider   Choice offered to / List presented to:     DME arranged  NA      DME agency  NA     HH arranged  HH-1 RN      [facility]   Status of service:  Completed, signed off Medicare Important Message given?  NA - LOS <3 / Initial given by admissions (If response is "NO", the following Medicare IM given date fields will be blank) Date Medicare IM given:   Date Additional Medicare IM given:    Discharge Disposition:  HOME W HOME HEALTH SERVICES  Per UR Regulation:  Reviewed for med. necessity/level of care/duration of stay  If discussed at Long Length of Stay Meetings, dates discussed:    Comments:  03/04/13 Akima Slaugh RN,BSN NCM [phone] [facility]-WOUND CARE.[facility] AWARE OF RESUMPTION.PT/OT-N/A,SIGNED OFF.NO FURTHER ORDERS OR D/C NEEDS.ALSO TC [facility] @ [facility] CONFIRMED HHRN. D/C BACK TO [facility].NO NEEDS OR ORDERS.  [id number]/Rhonda Earlene Plateravis, RN, BSN, [place] [phone] Chart Reviewed for discharge and hospital needs. Discharge needs at time of review:  None present will follow for needs. Review of patient progress due on [id number].

## 2013-03-04 NOTE — Discharge Summary (Signed)
Physician Discharge Summary  Carolyn Ford WUJ:[id number] DOB: 01-20-1940 DOA: 02/27/2013  PCP: Florentina Jenny, MD  Admit date: 02/27/2013 Discharge date: 03/04/2013  Time spent: 35 minutes  Recommendations for Outpatient Follow-up:  1. Need further evaluation of dementia.   Discharge Diagnoses:    Altered mental status   Encephalopathy   Dementia.    Discharge Condition: Stable.   Diet recommendation: Heart Healty  Filed Weights   02/27/13 1745 02/28/13 0400 03/02/13 0700  Weight: 78.9 kg (173 lb 15.1 oz) 79.6 kg (175 lb 7.8 oz) 79.153 kg (174 lb 8 oz)    History of present illness:  Carolyn Ford is a 73 y.o. female with PMH significant for chronic paraplegia, Polio, Hypertension, H/O VT who was brought to ED from nursing due to AMS. Per report patient was very difficult to wake, she was unresponsive, she didn't respond to sternal rub. She was notice to be more confused by SNF staff the day prior to admission. On her way to ED she wake up.  In the ED, she was found to hypothermic, hypoglycemic. CT head negative for acute bleed.  During my evaluation patient tremorous at time. She was alert, following command. Her Hr at time increased to 170 to 200. She was alert , no complaints at that time. No chest pain, dyspnea. Her hr decreased to 60 to 90.  She received IV fluids, half amp of D 50.    Hospital Course:  1-Encephalopathy, AMS;hypoglycemia ? Hypothermia ?[name] Patient with history of dementia. This appears to got worse over last 3 weeks per staff at ALF. Ct head negative for acute bleed. TSH normal , B 12 ; 1384 , RPR negative. Chest x ray and UA negative for infections. MRI negative for stroke.  -Patient more calm. She wake up to answer questions. She is willing to eat.  -She is oriented to place and person. Still confuse.  -Component of delirium, and or worsening dementia.  2-Hypoglycemia; unclear etiology. Not on oral hypoglycemic agents. Patient was hypothermic. cortisol level  normal at 12..  -DC fluids D 5 NS. regular diet. Resolved.  3-Hypothermia; Resolved.  Was initially treated with bear hugger. Blood culture no growth.  4-Dementia; resume medications.  5-influenza panel ordered; per ED physician patient was on tamiflu prophylaxis. Multiple cases of influeza at her facility. Influenza panel negative.  6-Chronic Thrombocytopenia; stable.  Resolved.  Questionable Arrhthymias; no evidence of arrythmia. Episode was likely artifact from tremors. Troponin negative, check ECHO pending. Mg level normal. Appreciate Dr Ladona Ridgel evaluation.    Procedures: ECHO; Left ventricle: Technically difficult study. The cavity size was normal. Wall thickness was increased in a pattern of mild LVH. The estimated ejection fraction was 60%. Regional wall motion abnormalities cannot be excluded. - Aortic valve: Sclerosis without stenosis. No significant regurgitation.   Consultations:  none  Discharge Exam: Filed Vitals:   03/04/13 0635  BP: 120/76  Pulse: 86  Temp: 97.5 F (36.4 C)  Resp: 18    General: No distress.  Cardiovascular: S 1, S 2 RRR Respiratory: CTA Neuro; alert, following command.   Discharge Instructions  Discharge Orders   Future Orders Complete By Expires   Diet - low sodium heart healthy  As directed    Increase activity slowly  As directed        Medication List    STOP taking these medications       KLOR-CON M20 20 MEQ tablet  Generic drug:  potassium chloride SA     oseltamivir 75 MG  capsule  Commonly known as:  TAMIFLU     QUEtiapine 25 MG tablet  Commonly known as:  SEROQUEL     sodium chloride 1 G tablet     spironolactone 50 MG tablet  Commonly known as:  ALDACTONE      TAKE these medications       acetaminophen 325 MG tablet  Commonly known as:  TYLENOL  Take 325 mg by mouth every 6 (six) hours as needed for pain.     aspirin 81 MG chewable tablet  Chew 81 mg by mouth daily.     calcium-vitamin D 500-200  MG-UNIT per tablet  Commonly known as:  OSCAL WITH D  Take 1 tablet by mouth daily.     divalproex 250 MG 24 hr tablet  Commonly known as:  DEPAKOTE ER  Take 250 mg by mouth 3 (three) times daily.     donepezil 5 MG tablet  Commonly known as:  ARICEPT  Take 5 mg by mouth every evening.     gabapentin 100 MG capsule  Commonly known as:  NEURONTIN  Take 100 mg by mouth daily.     guaifenesin 400 MG Tabs tablet  Commonly known as:  HUMIBID E  Take 400 mg by mouth every 4 (four) hours as needed (cough).     LORazepam 0.5 MG tablet  Commonly known as:  ATIVAN  Take 0.5 tablets (0.25 mg total) by mouth 2 (two) times daily as needed for anxiety.     memantine 5 MG tablet  Commonly known as:  NAMENDA  Take 5 mg by mouth 2 (two) times daily.     sertraline 50 MG tablet  Commonly known as:  ZOLOFT  Take 1 tablet (50 mg total) by mouth daily.       No Known Allergies     Follow-up Information   Follow up with Florentina JennyRIPP, HENRY, MD.   Specialty:  Family Medicine   Contact information:   [phone] [place] [id number] [phone]        The results of significant diagnostics from this hospitalization (including imaging, microbiology, ancillary and laboratory) are listed below for reference.    Significant Diagnostic Studies: Ct Head Wo Contrast  02/27/2013   CLINICAL DATA:  Unresponsive. Altered mental status. History of dementia.  EXAM: CT HEAD WITHOUT CONTRAST  TECHNIQUE: Contiguous axial images were obtained from the base of the skull through the vertex without intravenous contrast.  COMPARISON:  05/20/2012  FINDINGS: There is no evidence for acute hemorrhage, hydrocephalus, mass lesion, or abnormal extra-axial fluid collection. No definite CT evidence for acute infarction. Diffuse loss of parenchymal volume is consistent with atrophy. Patchy low attenuation in the deep hemispheric and periventricular white matter is nonspecific, but likely reflects chronic  microvascular ischemic demyelination.  Hypoplastic left sphenoid sinus shows chronic opacification. There is been interval progression of circumferential mucosal disease in the right sphenoid sinus. The remaining visualized paranasal sinuses and mastoid air cells are clear.  IMPRESSION: Stable CT evaluation of the brain. There is atrophy with chronic small vessel white matter ischemic demyelination. No acute findings.  Interval progression of sphenoid sinusitis.   Electronically Signed   By: Kennith CenterEric  Mansell M.D.   On: 02/27/2013 14:09   Mr Brain Wo Contrast  03/01/2013   CLINICAL DATA:  Severe confusion.  Evaluate for possible stroke.  EXAM: MRI HEAD WITHOUT CONTRAST  TECHNIQUE: Multiplanar, multiecho pulse sequences of the brain and surrounding structures were obtained without intravenous contrast.  COMPARISON:  Head CT 02/27/2013  FINDINGS: Images are mildly degraded by motion artifact despite premedication and repeating sequences.  There is no acute infarct. There is no evidence of intracranial hemorrhage. There is mild to moderate generalized cerebral atrophy. Patchy T2 hyperintensities within the periventricular white matter are nonspecific but compatible with mild chronic small vessel ischemic disease. There is no evidence of mass, midline shift, or extra-axial fluid collection.  Major intracranial vascular flow voids are preserved. Orbits are unremarkable. Prominent sphenoid sinus mucosal thickening is present. Mastoid air cells are clear.  IMPRESSION: 1. No evidence of acute intracranial abnormality. 2. Cerebral atrophy and mild chronic small vessel ischemic disease.   Electronically Signed   By: Sebastian Ache   On: 03/01/2013 12:01   Dg Chest Portable 1 View  02/27/2013   CLINICAL DATA:  Altered mental status.  EXAM: PORTABLE CHEST - 1 VIEW  COMPARISON:  06/18/2012  FINDINGS: Mild cardiomegaly with normal mediastinal contours. No acute infiltrate or edema. No effusion or pneumothorax. No acute osseous  findings. 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  IMPRESSION: No active disease.   Electronically Signed   By: Tiburcio Pea M.D.   On: 02/27/2013 13:56    Microbiology: Recent Results (from the past 240 hour(s))  CULTURE, BLOOD (ROUTINE X 2)     Status: None   Collection Time    02/27/13  4:30 PM      Result Value Ref Range Status   Specimen Description BLOOD RIGHT ARM   Final   Special Requests BOTTLES DRAWN AEROBIC AND ANAEROBIC 4CC EACH   Final   Culture  Setup Time     Final   Value: 02/27/2013 20:01     Performed at [facility]   Culture     Final   Value:        BLOOD CULTURE RECEIVED NO GROWTH TO DATE CULTURE WILL BE HELD FOR 5 DAYS BEFORE ISSUING A FINAL NEGATIVE REPORT     Performed at [facility]   Report Status PENDING   Incomplete  CULTURE, BLOOD (ROUTINE X 2)     Status: None   Collection Time    02/27/13  4:30 PM      Result Value Ref Range Status   Specimen Description BLOOD RIGHT ARM   Final   Special Requests BOTTLES DRAWN AEROBIC AND ANAEROBIC 4CC EACH   Final   Culture  Setup Time     Final   Value: 02/27/2013 20:01     Performed at [facility]   Culture     Final   Value:        BLOOD CULTURE RECEIVED NO GROWTH TO DATE CULTURE WILL BE HELD FOR 5 DAYS BEFORE ISSUING A FINAL NEGATIVE REPORT     Performed at [facility]   Report Status PENDING   Incomplete  MRSA PCR SCREENING     Status: Abnormal   Collection Time    02/27/13  5:39 PM      Result Value Ref Range Status   MRSA by PCR POSITIVE (*) NEGATIVE Final   Comment:            The GeneXpert MRSA Assay (FDA     approved for NASAL specimens     only), is one component of a     comprehensive MRSA colonization     surveillance program. It is not     intended  to diagnose MRSA     infection nor to guide or     monitor treatment for     MRSA infections.     RESULT CALLED TO, READ  BACK BY AND VERIFIED WITH:     JOHNSON,R RN @1902  ON 02.18.2015 BY MCREYNOLDS,B     Labs: Basic Metabolic Panel:  Recent Labs Lab 02/27/13 1300 02/27/13 1639 02/28/13 0325 03/01/13 0503 03/03/13 0545 03/04/13 0410  NA 139  --  139 140 140 142  K 4.3  --  3.6* 3.9 3.5* 3.7  CL 102  --  105 108 108 109  CO2 27  --  23 23 23 23   GLUCOSE 74  --  115* 96 104* 84  BUN 32*  --  19 20 19 20   CREATININE 0.48*  --  0.32* 0.35* 0.41* 0.42*  CALCIUM 9.9  --  8.5 8.6 8.4 8.6  MG  --  1.8  --   --   --   --    Liver Function Tests:  Recent Labs Lab 02/27/13 1300 02/28/13 0325  AST 12 13  ALT 8 8  ALKPHOS 74 59  BILITOT 0.4 0.3  PROT 6.6 5.3*  ALBUMIN 3.2* 2.5*   No results found for this basename: LIPASE, AMYLASE,  in the last 168 hours  Recent Labs Lab 02/27/13 1300  AMMONIA 15   CBC:  Recent Labs Lab 02/27/13 1300 02/28/13 0325 03/01/13 0503 03/03/13 0545  WBC 7.1 6.4 6.5 6.0  NEUTROABS 4.6  --   --   --   HGB 14.1 12.9 12.6 12.1  HCT 41.5 38.7 37.2 36.0  MCV 94.3 94.2 93.7 93.8  PLT 118* 91* 100* 98*   Cardiac Enzymes:  Recent Labs Lab 02/27/13 1639 02/27/13 2153 02/28/13 0324  TROPONINI <0.30 <0.30 <0.30   BNP: BNP (last 3 results) No results found for this basename: PROBNP,  in the last 8760 hours CBG:  Recent Labs Lab 03/03/13 0651 03/03/13 1128 03/03/13 1717 03/04/13 0017 03/04/13 0652  GLUCAP 90 114* 95 87 72       Signed:  Darious Rehman  [facility] 03/04/2013, 8:58 AM

## 2013-03-04 NOTE — Progress Notes (Addendum)
Patient cleared for discharge. Information faxed to [facility].  Rhyse Loux C. Tianna Baus MSW, Alexander MtLCSW [phone]  Patient has been accepted back to [facility]. Packet copied and placed in [place]. CSW called [name], patient's POA, he is very glad patient is doing well enough to return to [facility] and they have made arrangements to move patient to the memory care unit. ptar called for transportation.  Fernand Sorbello C. Diamone Whistler MSW, LCSW [phone]

## 2013-03-05 LAB — CULTURE, BLOOD (ROUTINE X 2)
CULTURE: NO GROWTH
Culture: NO GROWTH

## 2014-06-14 IMAGING — CT CT HEAD W/O CM
2 series · 16 of 30 positions shown, 20 images · non-contrast
Comparison: None

CLINICAL DATA: Altered mental status, hypotension.

CT HEAD WITHOUT CONTRAST
TECHNIQUE: Contiguous axial images were obtained from the base of
the skull through the vertex without contrast.

[Series 2: head w/o · axial · non-contrast · 0.40mm/px · z∈[+1229,+1364]mm · 13 of 33 slices shown, 17 images]
[im 3/33  brain]
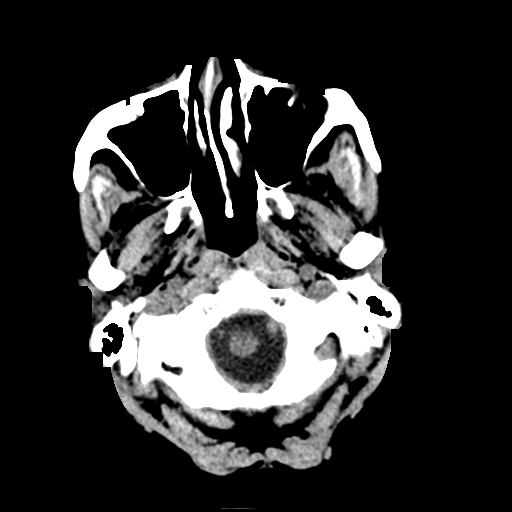
[im 3/33  bone]
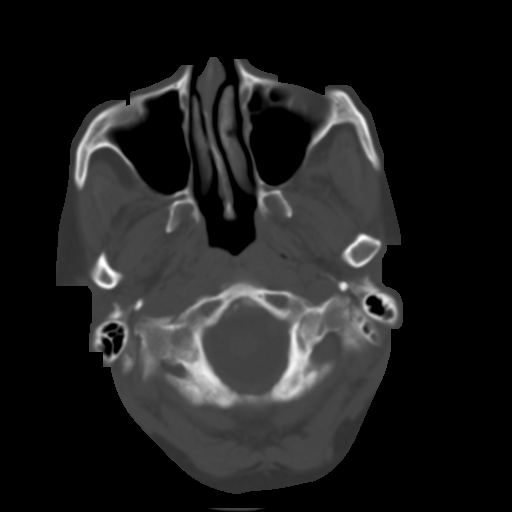
[im 5/33  brain]
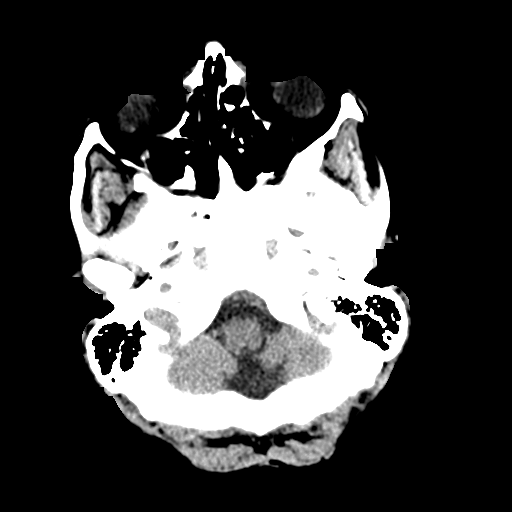
[im 7/33  brain]
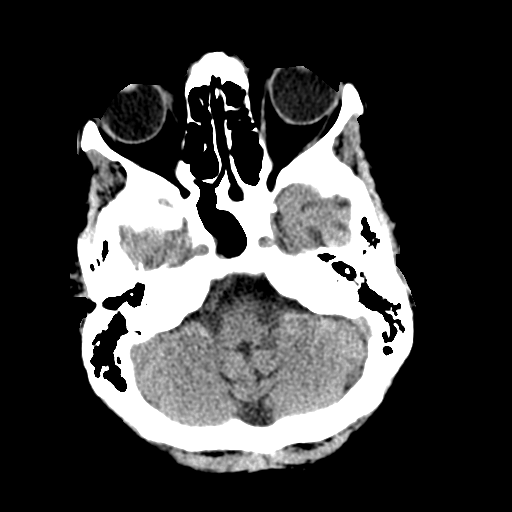
[im 10/33  brain]
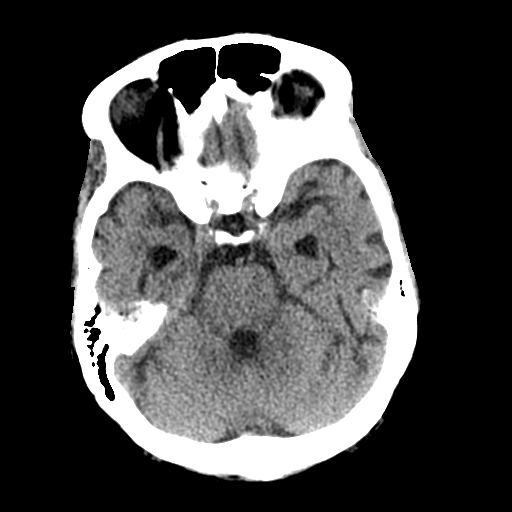
[im 12/33  brain]
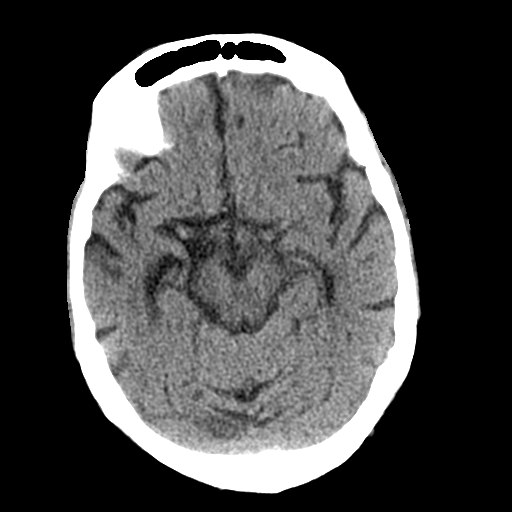
[im 12/33  bone]
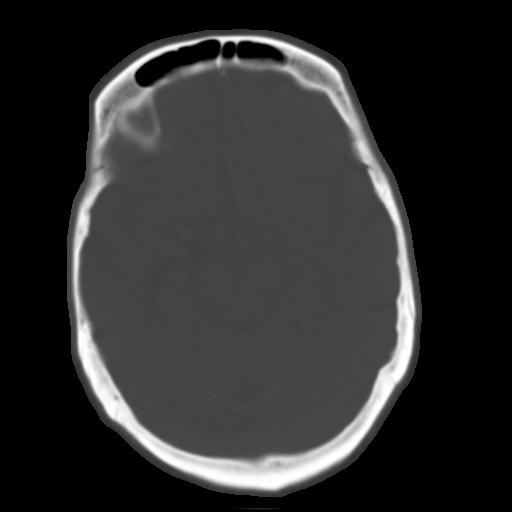
[im 14/33  brain]
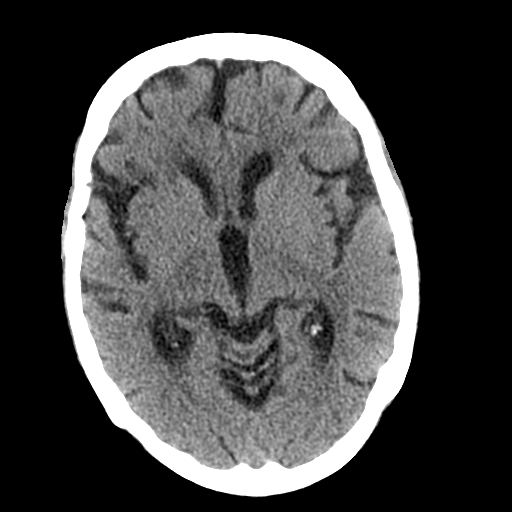
[im 17/33  brain]
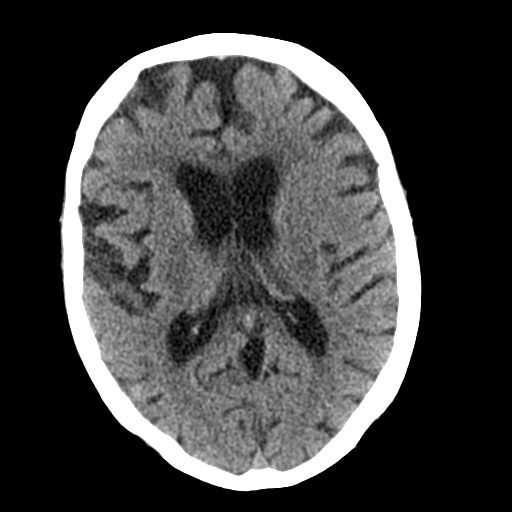
[im 19/33  brain]
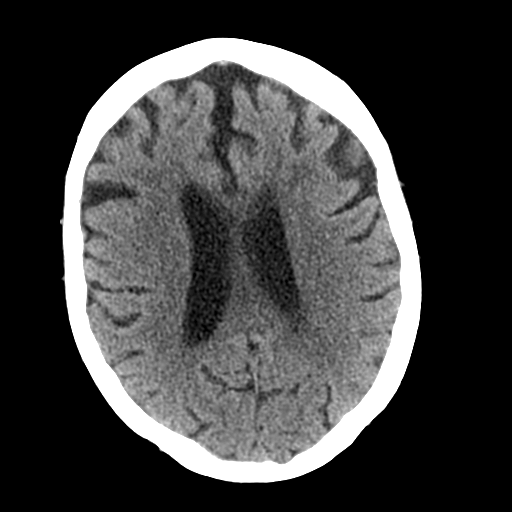
[im 21/33  brain]
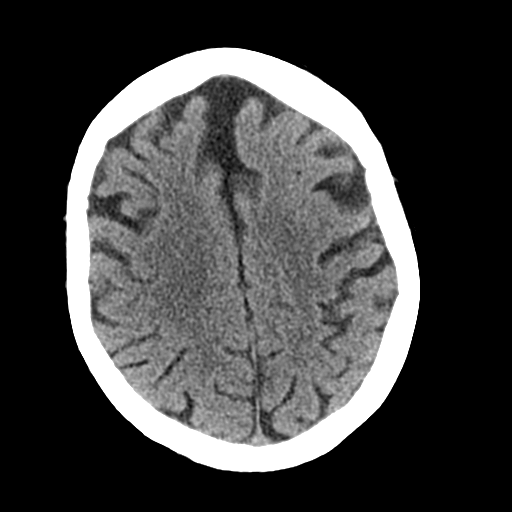
[im 21/33  bone]
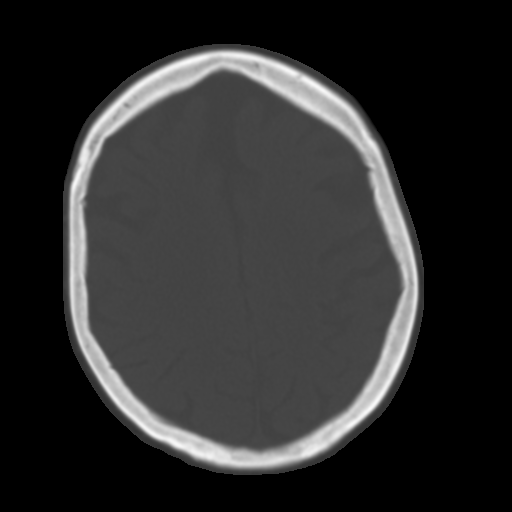
[im 23/33  brain]
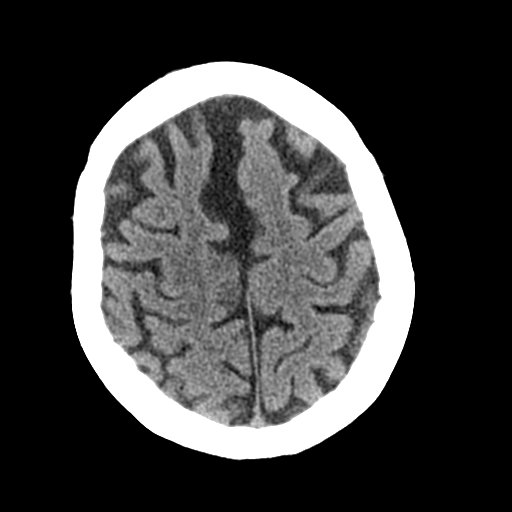
[im 26/33  brain]
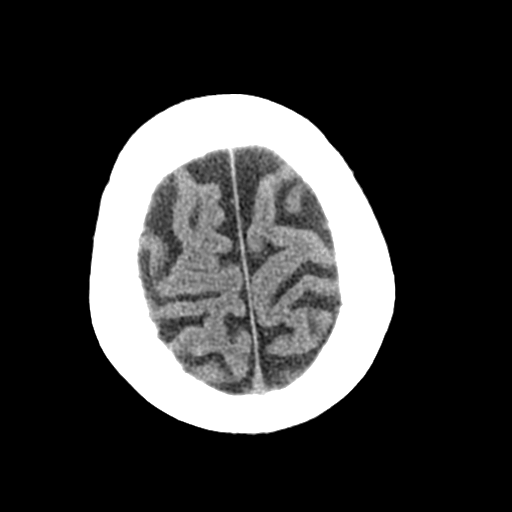
[im 28/33  brain]
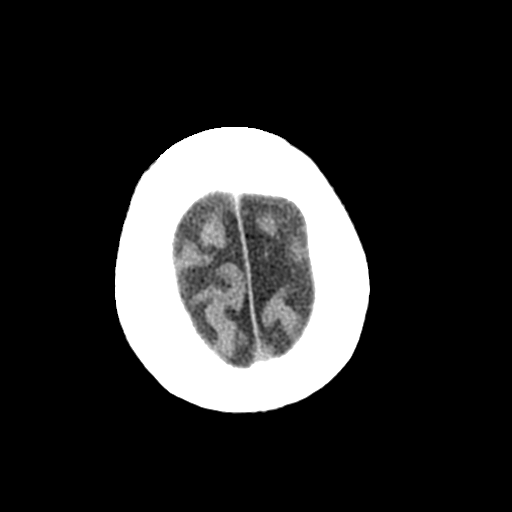
[im 30/33  brain]
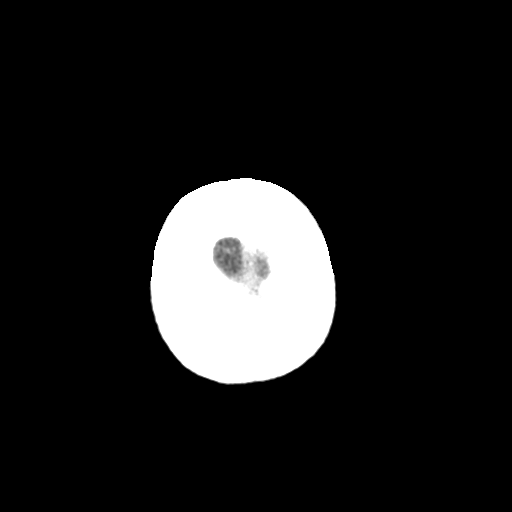
[im 30/33  bone]
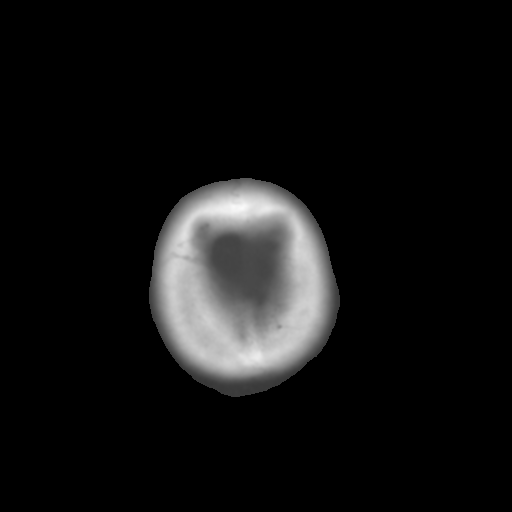

[Series 3: bone windows · axial · 0.40mm/px · z∈[+1229,+1274]mm · 3 of 33 slices shown]
[im 3/33  bone]
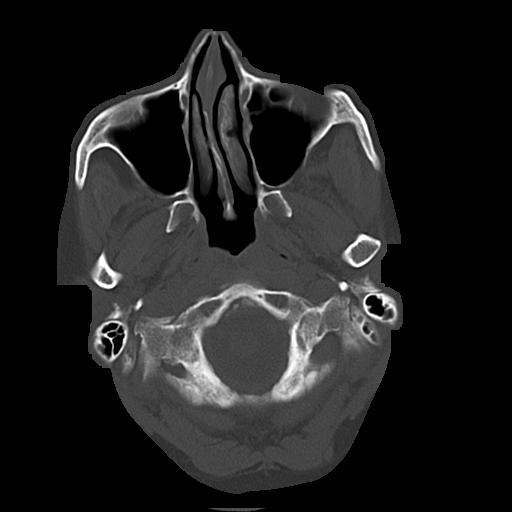
[im 7/33  bone]
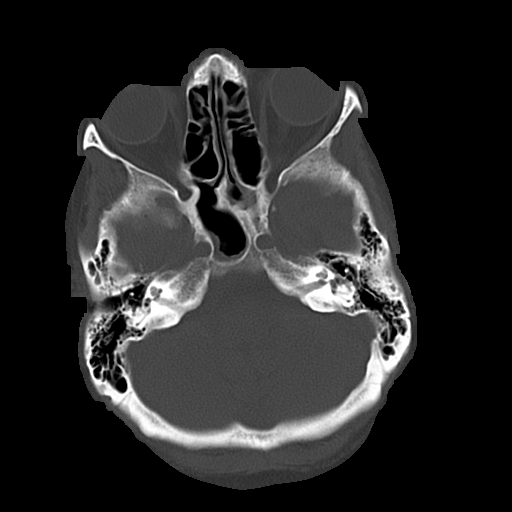
[im 12/33  bone]
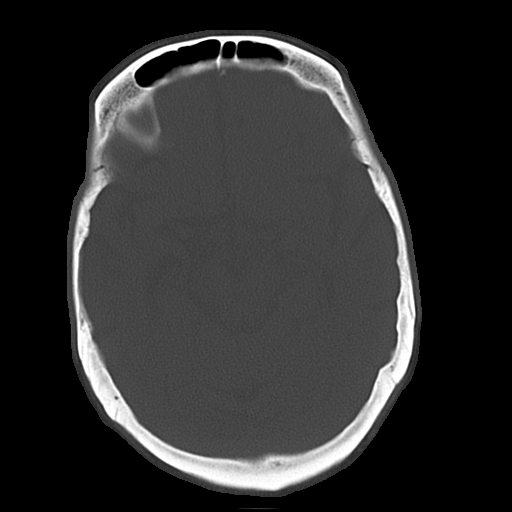

[16 of 30 positions shown; findings below may reference images not displayed]

FINDINGS: There is atrophy and chronic small vessel disease
changes. No acute intracranial abnormality.  Specifically, no
hemorrhage, hydrocephalus, mass lesion, acute infarction, or
significant intracranial injury.  No acute calvarial abnormality.
Visualized paranasal sinuses and mastoids clear.  Orbital soft
tissues unremarkable.
IMPRESSION: No acute intracranial abnormality.

Atrophy, chronic microvascular disease.

## 2014-06-14 IMAGING — CR DG CHEST 1V PORT
1 series · 1 of 1 positions shown · non-contrast
Comparison: 02/12/2012

CLINICAL DATA: Altered mental status.

PORTABLE CHEST - 1 VIEW

[AP]
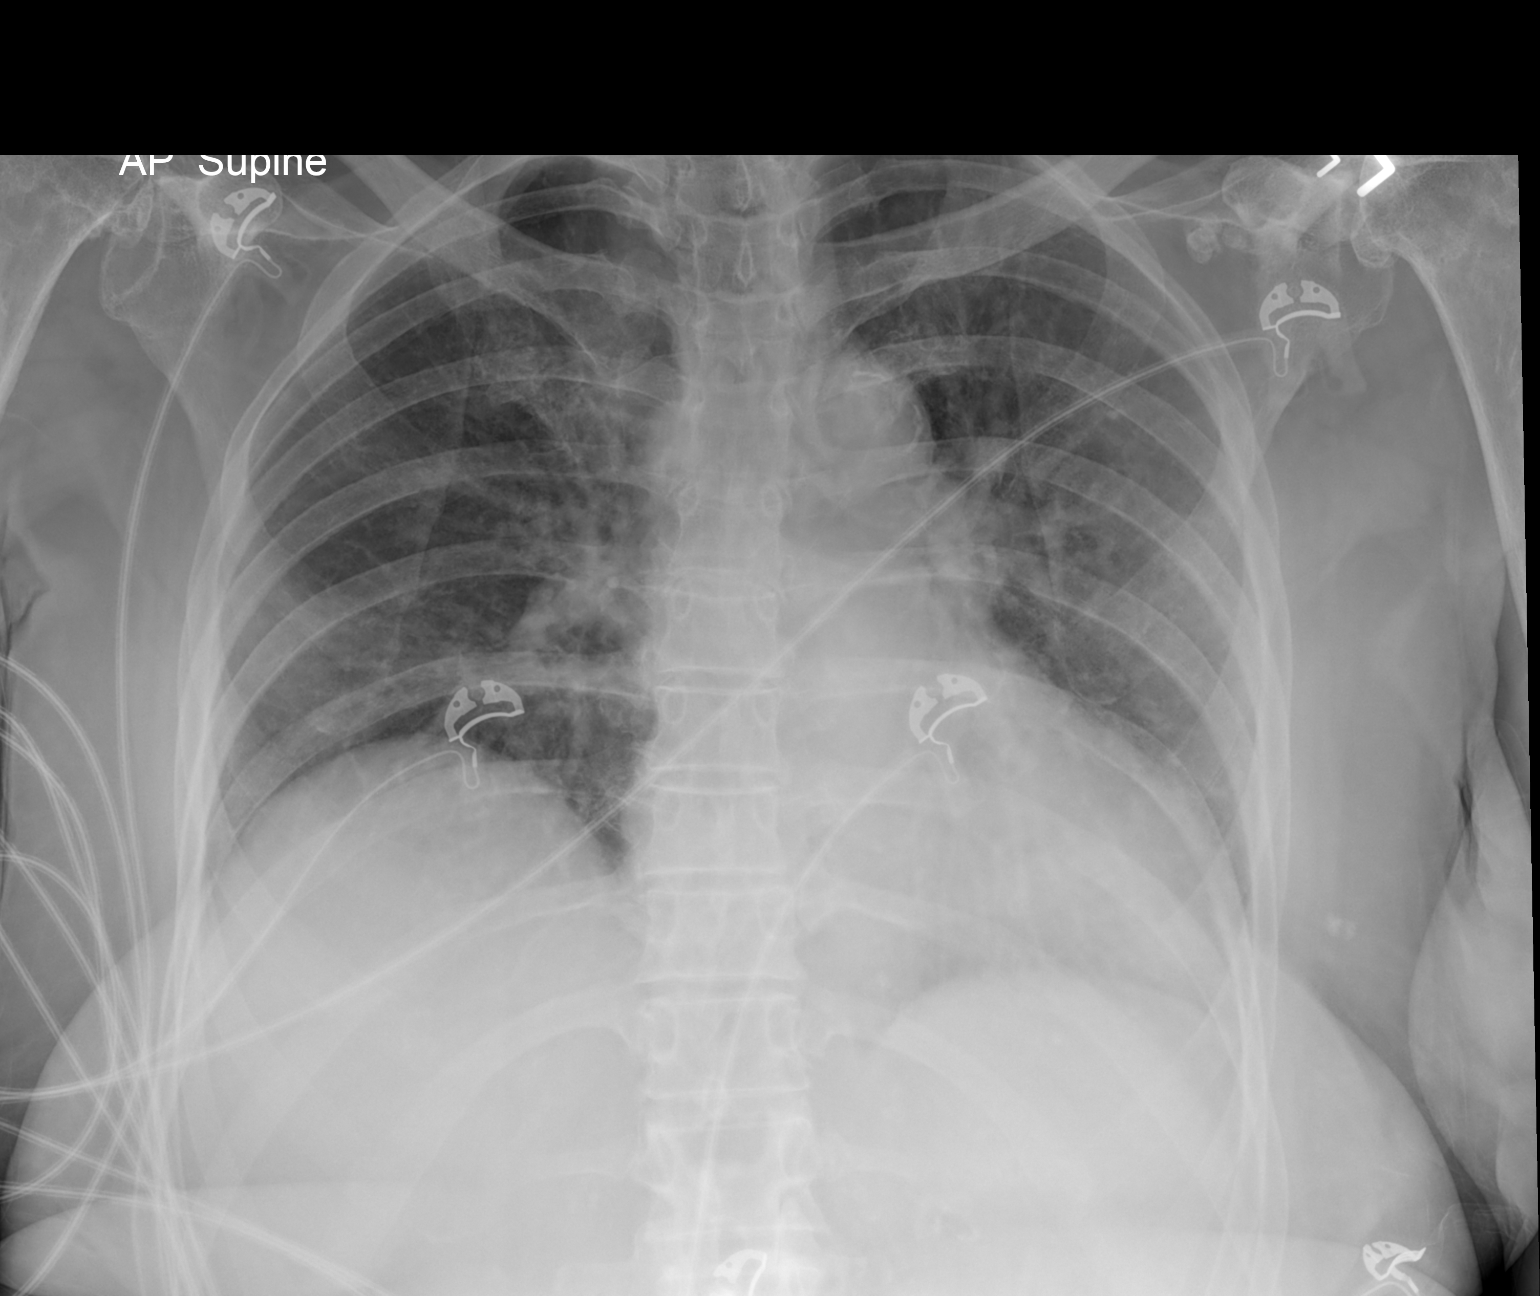

[1 of 1 positions shown; findings below may reference images not displayed]

FINDINGS: Mild cardiomegaly and vascular congestion.  No overt
edema.  No confluent opacity or effusion.  No acute bony
abnormality.  Severe degenerative changes in the shoulders
bilaterally
IMPRESSION: Mild cardiomegaly, vascular congestion.

## 2015-03-24 IMAGING — CR DG CHEST 1V PORT
1 series · 1 of 1 positions shown · non-contrast
Comparison: 06/18/2012

CLINICAL DATA: Altered mental status.

EXAM:
PORTABLE CHEST - 1 VIEW

[AP]
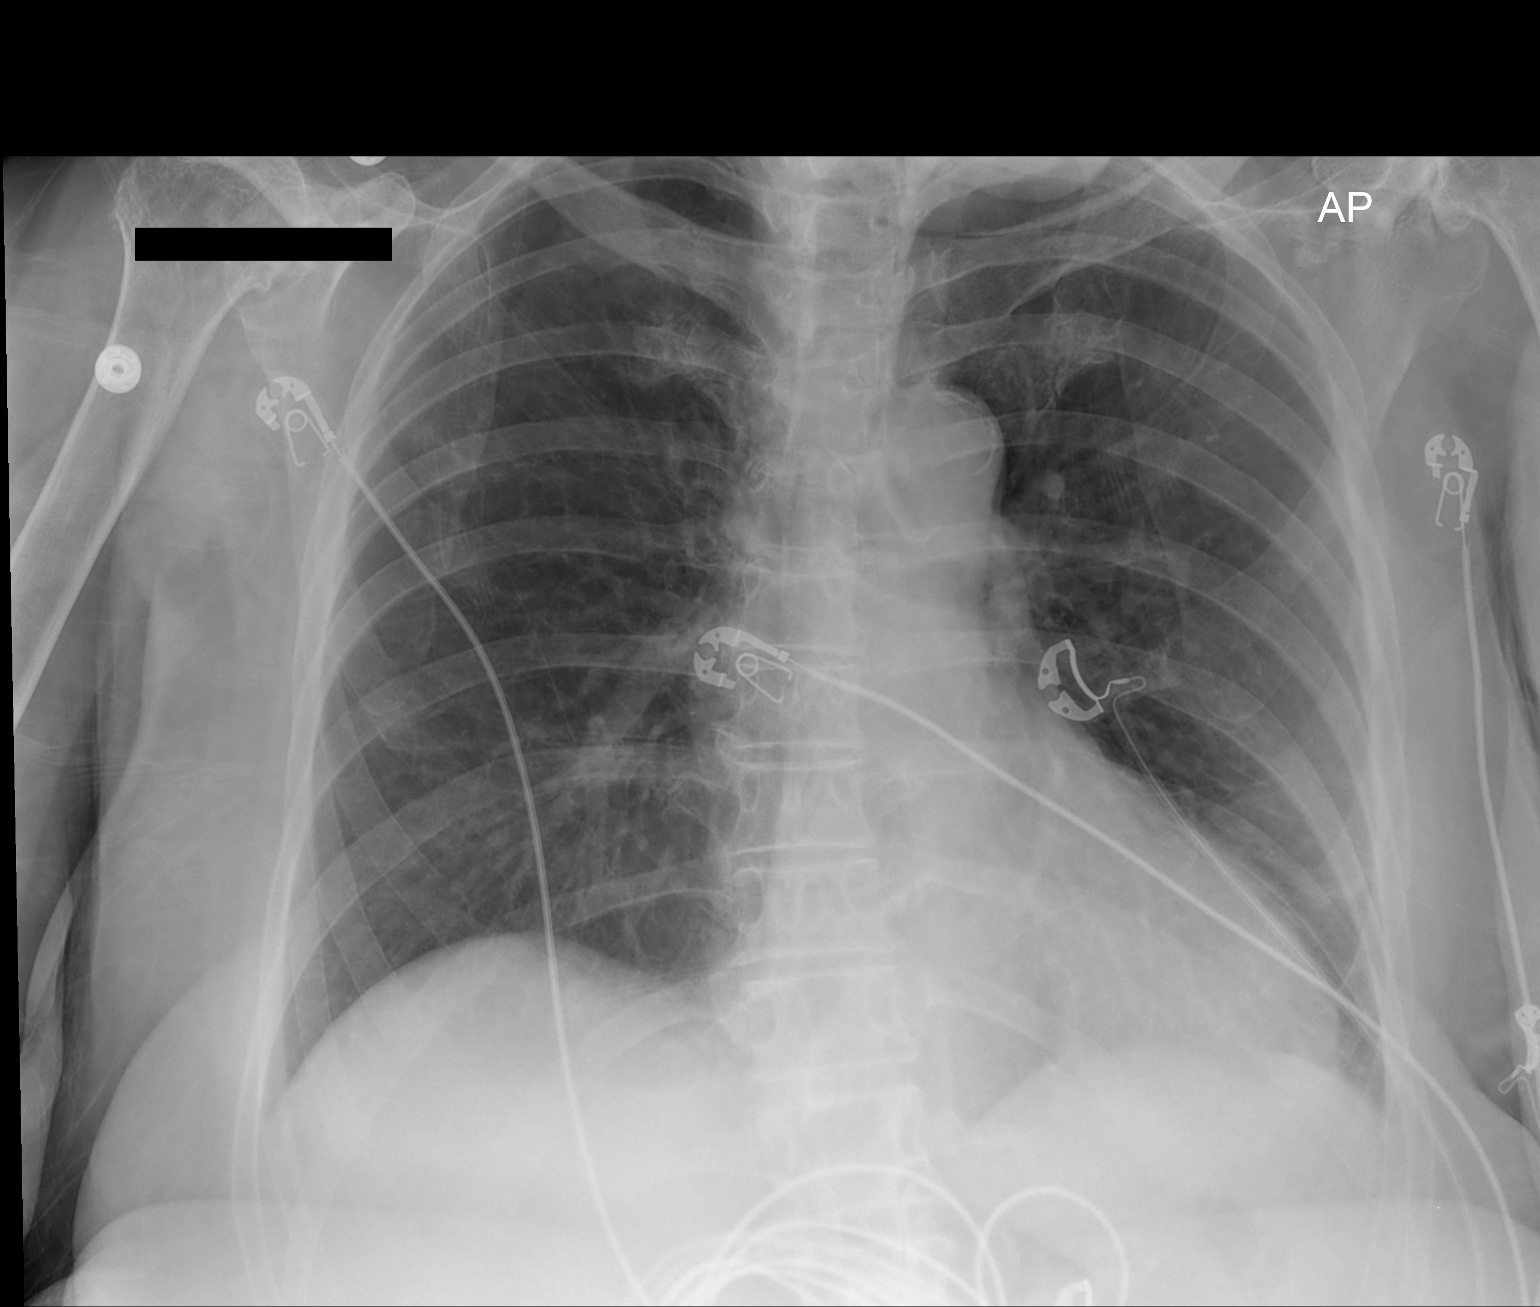

[1 of 1 positions shown; findings below may reference images not displayed]

FINDINGS: Mild cardiomegaly with normal mediastinal contours. No acute
infiltrate or edema. No effusion or pneumothorax. No acute osseous
findings. There is advanced bilateral glenohumeral osteoarthritis
with high ridging humeral heads and multiple loose bodies on the
left.
IMPRESSION: No active disease.

## 2017-07-14 ENCOUNTER — Non-Acute Institutional Stay: Payer: Medicare Other | Admitting: Internal Medicine

## 2017-07-14 ENCOUNTER — Encounter: Payer: Self-pay | Admitting: Internal Medicine

## 2017-07-14 VITALS — BP 140/80 | HR 80 | Resp 12

## 2017-07-14 DIAGNOSIS — F0280 Dementia in other diseases classified elsewhere without behavioral disturbance: Secondary | ICD-10-CM

## 2017-07-14 DIAGNOSIS — Z515 Encounter for palliative care: Secondary | ICD-10-CM

## 2017-07-14 DIAGNOSIS — G309 Alzheimer's disease, unspecified: Secondary | ICD-10-CM

## 2017-07-16 ENCOUNTER — Encounter: Payer: Self-pay | Admitting: Internal Medicine

## 2017-07-16 NOTE — Progress Notes (Signed)
07/14/2017  INITIAL PALLIATIVE CARE CONSULT Carolyn Ford, Carolyn Ford. DOB: 08-07-1940. [facility]  [address], [place] (room [address]) REFERRING PROVIDER: Dr. Redmond Schoolripp Significant other: (nephew) Mechele ClaudeCharles Rakestraw [phone]  BILLABLE  ICD-10: Alzheimer's Dementia   IMPRESSION/RECOMMENDATIONS: 1. Alzheimer's dementia (FAST 7c).  -100% dependent for dressing, hygiene, and transfers [facility](Hoyer lift). Able to eat finger foods, consuming 75% of meals. Can manipulate a cup.  Decreased verbalization; only an occasional syllable or two. Appears alert and oriented to self only. Increase daytime sleep, sometimes sleeping through meals. Can be difficult to arouse.  Unable to follow commands. Able to self-propel in wheelchair (by pushing with feet). Weight (06/01) 153 lbs. At 5'6" reflects a BMR of  23.1 kg/m2. This is up 5.6lbs from March (still down 4 lbs over the past 5 months). Winces when LEs are manipulated when rolled side to side in bd.  2. Advanced Care Planning: DNR on chart.  I spent 45 minutes providing this consultation (from  10:30am to 11:15am). More than 50% of that time was spent coordinating communication.  HPI: 77 yo female with Alzheimer's Dementia, poliomyelitis, paraplegia, ventricular tachycardia, HTN, Schizoaffective disorder, and bipolar depression. She was under hospice services 11/2015 to 07/07/2017 for terminal diagnosis of Alzheimer's Dementia, but at time of most recent certification period was found to have improved oral intake with ability to feed herself, weight gain, and improvement in strength so that she was able to self-propel herself in her wheelchair. Referred to Palliative Care for ongoing assistance with symptom management, help with coordination of resources, and ongoing discussions regarding goals of care/advanced directives  CODE STATUS: DNR PPS: 30%.    HOSPICE ELIGIBILITY: No; recently discharged from hospice services due to anticipated prognosis greater  than 6 months.   MEDICATIONS :  Depakot Sprinkels 125mg  Cap bid, Mapap 325mg  2 tabs q6h prn pain, potassium chloride crys ER 10 MEQ bid, tylenol q6h prn, Ativan 2mg  q6h prn, Morphine Sulfate 20mg /ml 5mg  bid and q4h prn, Imodium A-D 2mg  q6h prn, Psyllium 500mg  qhs (constipation), Senna S 8.6-6-mg 1 tab 3 x/week,  ALLERGIES: NKDA  PAST MEDICAL HISTORY: Alzheimer's dementia (FAST 7c), poliomyelitis/paraplegia (age 89; unable to walk since), ventricular tachycardia, HTN, hypokalemia, frequent UTI, anorexia, schizoaffective disorder, bipolar depression type. SH[facility]  PHYSICAL EXAM: The physical exam was remarkable for a Caucasian female alert and sitting up in wheelchair. She is smiling and pleasantly engaging; speaking a few off topic words.  VS: 140 systolic, HR 80, Sat 100% room air, RR 12 Lungs CTA. Heart irregular rate/rhythm; early beats..  Abd soft, non-distended with NABS.  Bilateral LE swelling to knees> R with mild to moderate pitting left, only slightly pitting right. LE's are cool, with diminished pulses.  Holly BodilyMary Nevea Spiewak NP-C

## 2017-12-19 ENCOUNTER — Encounter: Payer: Self-pay | Admitting: Nurse Practitioner

## 2017-12-19 ENCOUNTER — Non-Acute Institutional Stay: Payer: Federal, State, Local not specified - PPO | Admitting: Nurse Practitioner

## 2017-12-19 NOTE — Progress Notes (Deleted)
[facility] Telephone: [phone]) [phone] Fax: [phone]) [phone]  PATIENT NAME: Carolyn BrookeSuzanne Leon DOB: 03/12/1940 MRN: [id number]  PRIMARY CARE PROVIDER:   Florentina Jennyripp, Henry, MD  REFERRING PROVIDER:  Florentina Jennyripp, Henry, MD [phone] [address] [place], [place] [id number]  RESPONSIBLE PARTY:     HISTORY OF PRESENT ILLNESS:  Carolyn Ford is a 77 y.o. year old female with multiple medical problems including ***. Palliative Care was asked to help address goals of care.    Interim history : facility staff report no acute problems; eating well; no behavioral problems  ASSESSMENT/PLAN:                    I spent *** minutes providing this consultation,  from *** to ***. More than 50% of the time in this consultation was spent coordinating communication.    CODE STATUS: DNR  PPS: 0% HOSPICE ELIGIBILITY/DIAGNOSIS: TBD  PAST MEDICAL HISTORY:  Past Medical History:  Diagnosis Date  . Chronic paraplegia (HCC)   . Dementia   . Heart murmur   . Hypertension   . Polio   . Thrombocytopenia (HCC)   . V-tach [facility](HCC)     SOCIAL HX:  Social History   Tobacco Use  . Smoking status: Never Smoker  . Smokeless tobacco: Never Used  Substance Use Topics  . Alcohol use: Yes    Comment: occasional wine    ALLERGIES: No Known Allergies   PERTINENT MEDICATIONS:  Outpatient Encounter Medications as of 12/19/2017  Medication Sig  . acetaminophen (TYLENOL) 325 MG tablet Take 325 mg by mouth every 6 (six) hours as needed for pain.  [name]. aspirin 81 MG chewable tablet Chew 81 mg by mouth daily.  . calcium-vitamin D (OSCAL WITH D) 500-200 MG-UNIT per tablet Take 1 tablet by mouth daily.  . divalproex (DEPAKOTE ER) 250 MG 24 hr tablet Take 250 mg by mouth 3 (three) times daily.  [name]. donepezil (ARICEPT) 5 MG tablet Take 5 mg by mouth every evening.  . gabapentin (NEURONTIN) 100 MG capsule Take 100 mg by mouth daily.  [name]. guaifenesin (HUMIBID E) 400 MG TABS tablet Take 400 mg by mouth every 4 (four)  hours as needed (cough).  . LORazepam (ATIVAN) 0.5 MG tablet Take 0.5 tablets (0.25 mg total) by mouth 2 (two) times daily as needed for anxiety.  . memantine (NAMENDA) 5 MG tablet Take 5 mg by mouth 2 (two) times daily.  . sertraline (ZOLOFT) 50 MG tablet Take 1 tablet (50 mg total) by mouth daily.   No facility-administered encounter medications on file as of 12/19/2017.     PHYSICAL EXAM:   General: NAD, frail appearing, thin Cardiovascular: regular rate and rhythm Pulmonary: clear ant fields Abdomen: soft, nontender, + bowel sounds GU: no suprapubic tenderness Extremities: no edema, no joint deformities Skin: no rashes Neurological: Weakness but otherwise nonfocal  Seleen Walter G [place], NP

## 2019-11-11 DEATH — deceased
